# Patient Record
Sex: Female | Born: 1980 | Race: White | Hispanic: No | Marital: Single | Smoking: Never smoker
Health system: Southern US, Community
[De-identification: ages and names within clinical notes are randomized; demographics above are authoritative.]

## PROBLEM LIST (undated history)

## (undated) DIAGNOSIS — E778 Other disorders of glycoprotein metabolism: Secondary | ICD-10-CM

## (undated) DIAGNOSIS — T7840XA Allergy, unspecified, initial encounter: Secondary | ICD-10-CM

## (undated) DIAGNOSIS — Z8742 Personal history of other diseases of the female genital tract: Secondary | ICD-10-CM

## (undated) DIAGNOSIS — R771 Abnormality of globulin: Secondary | ICD-10-CM

## (undated) DIAGNOSIS — D649 Anemia, unspecified: Secondary | ICD-10-CM

## (undated) HISTORY — DX: Abnormality of globulin: R77.1

## (undated) HISTORY — DX: Personal history of other diseases of the female genital tract: Z87.42

## (undated) HISTORY — DX: Other disorders of glycoprotein metabolism: E77.8

## (undated) HISTORY — DX: Anemia, unspecified: D64.9

## (undated) HISTORY — DX: Allergy, unspecified, initial encounter: T78.40XA

---

## 2013-11-12 ENCOUNTER — Ambulatory Visit: Payer: Self-pay | Admitting: Family Medicine

## 2014-01-27 ENCOUNTER — Emergency Department: Payer: Self-pay | Admitting: Emergency Medicine

## 2014-01-27 LAB — COMPREHENSIVE METABOLIC PANEL
ANION GAP: 9 (ref 7–16)
AST: 19 U/L (ref 15–37)
Albumin: 4 g/dL (ref 3.4–5.0)
Alkaline Phosphatase: 67 U/L
BILIRUBIN TOTAL: 0.9 mg/dL (ref 0.2–1.0)
BUN: 11 mg/dL (ref 7–18)
CALCIUM: 8.8 mg/dL (ref 8.5–10.1)
Chloride: 106 mmol/L (ref 98–107)
Co2: 26 mmol/L (ref 21–32)
Creatinine: 0.96 mg/dL (ref 0.60–1.30)
EGFR (African American): >60
Glucose: 131 mg/dL — ABNORMAL HIGH (ref 65–99)
Osmolality: 282 (ref 275–301)
Potassium: 4 mmol/L (ref 3.5–5.1)
SGPT (ALT): 22 U/L
Sodium: 141 mmol/L (ref 136–145)
TOTAL PROTEIN: 8.2 g/dL (ref 6.4–8.2)

## 2014-01-27 LAB — URINALYSIS, COMPLETE
BILIRUBIN, UR: NEGATIVE
Glucose,UR: NEGATIVE mg/dL (ref 0–75)
NITRITE: NEGATIVE
PH: 5 (ref 4.5–8.0)
PROTEIN: NEGATIVE
Specific Gravity: 1.026 (ref 1.003–1.030)
WBC UR: 3 /HPF (ref 0–5)

## 2014-01-27 LAB — CBC WITH DIFFERENTIAL/PLATELET
Basophil #: 0 10*3/uL (ref 0.0–0.1)
Basophil %: 0.2 %
EOS ABS: 0.1 10*3/uL (ref 0.0–0.7)
Eosinophil %: 0.5 %
HCT: 48 % — ABNORMAL HIGH (ref 35.0–47.0)
HGB: 15.9 g/dL (ref 12.0–16.0)
Lymphocyte #: 0.6 10*3/uL — ABNORMAL LOW (ref 1.0–3.6)
Lymphocyte %: 4.1 %
MCH: 30.9 pg (ref 26.0–34.0)
MCHC: 33 g/dL (ref 32.0–36.0)
MCV: 93 fL (ref 80–100)
Monocyte #: 0.4 x10 3/mm (ref 0.2–0.9)
Monocyte %: 2.6 %
NEUTROS ABS: 14.3 10*3/uL — AB (ref 1.4–6.5)
Neutrophil %: 92.6 %
PLATELETS: 230 10*3/uL (ref 150–440)
RBC: 5.14 10*6/uL (ref 3.80–5.20)
RDW: 12.1 % (ref 11.5–14.5)
WBC: 15.4 10*3/uL — AB (ref 3.6–11.0)

## 2014-01-27 LAB — LIPASE, BLOOD: Lipase: 194 U/L (ref 73–393)

## 2014-01-27 LAB — PREGNANCY, URINE: Pregnancy Test, Urine: NEGATIVE m[IU]/mL

## 2014-01-28 LAB — MONONUCLEOSIS SCREEN: Mono Test: NEGATIVE

## 2014-01-29 ENCOUNTER — Emergency Department: Payer: Self-pay | Admitting: Emergency Medicine

## 2014-01-29 LAB — URINE CULTURE

## 2014-02-02 LAB — CULTURE, BLOOD (SINGLE)

## 2014-02-04 LAB — CULTURE, BLOOD (SINGLE)

## 2014-11-06 LAB — HM PAP SMEAR: HM PAP: NEGATIVE

## 2014-11-18 ENCOUNTER — Encounter: Payer: Self-pay | Admitting: Family Medicine

## 2014-12-18 ENCOUNTER — Telehealth: Payer: Self-pay | Admitting: Family Medicine

## 2014-12-18 NOTE — Telephone Encounter (Signed)
Routing to provider. I'm sending to both Dr. Laural BenesJohnson and Dr. Sherie DonLada. Dr. Sherie DonLada is out of the office today, but will be back tomorrow.

## 2014-12-18 NOTE — Telephone Encounter (Signed)
Pt called stated she has poison oak and the OTC meds are not helping, would like to know if something can be sent to her pharmacy. [place] [facility] [place]. Thanks.

## 2014-12-19 ENCOUNTER — Telehealth: Payer: Self-pay | Admitting: Family Medicine

## 2014-12-19 MED ORDER — TRIAMCINOLONE ACETONIDE 0.5 % EX OINT: 1.0000 "application " | TOPICAL_OINTMENT | Freq: Two times a day (BID) | CUTANEOUS | Status: DC | PRN

## 2014-12-19 NOTE — Telephone Encounter (Signed)
Let pt know she can use OTC anti-histamine such as claritin or allegra for itching; also, use OTC hydrocortisone for itching and rash; use the new Rx I just sent in for the worst spots, but that's too strong for face, groin, underarms

## 2014-12-19 NOTE — Addendum Note (Signed)
Addended by: LADA, Janit BernMELINDA P on: 12/19/2014 01:02 PM   Modules accepted: Orders

## 2014-12-19 NOTE — Telephone Encounter (Signed)
Patient notified

## 2014-12-25 ENCOUNTER — Emergency Department
Admission: EM | Admit: 2014-12-25 | Discharge: 2014-12-25 | Disposition: A | Discharge status: Home / Self Care | Payer: BLUE CROSS/BLUE SHIELD | Attending: Emergency Medicine | Admitting: Emergency Medicine

## 2014-12-25 DIAGNOSIS — Z880 Allergy status to penicillin: Secondary | ICD-10-CM | POA: Insufficient documentation

## 2014-12-25 DIAGNOSIS — Y9289 Other specified places as the place of occurrence of the external cause: Secondary | ICD-10-CM | POA: Insufficient documentation

## 2014-12-25 DIAGNOSIS — Z7951 Long term (current) use of inhaled steroids: Secondary | ICD-10-CM | POA: Insufficient documentation

## 2014-12-25 DIAGNOSIS — X58XXXA Exposure to other specified factors, initial encounter: Secondary | ICD-10-CM | POA: Insufficient documentation

## 2014-12-25 DIAGNOSIS — T161XXA Foreign body in right ear, initial encounter: Secondary | ICD-10-CM | POA: Diagnosis not present

## 2014-12-25 DIAGNOSIS — Y998 Other external cause status: Secondary | ICD-10-CM | POA: Insufficient documentation

## 2014-12-25 DIAGNOSIS — Z793 Long term (current) use of hormonal contraceptives: Secondary | ICD-10-CM | POA: Insufficient documentation

## 2014-12-25 DIAGNOSIS — Y9389 Activity, other specified: Secondary | ICD-10-CM | POA: Diagnosis not present

## 2014-12-25 DIAGNOSIS — Z79899 Other long term (current) drug therapy: Secondary | ICD-10-CM | POA: Diagnosis not present

## 2014-12-25 MED ORDER — NEOMYCIN-COLIST-HC-THONZONIUM 3.3-3-10-0.5 MG/ML OT SUSP
4.0000 [drp] | Freq: Once | OTIC | Status: DC
  Filled 2014-12-25: qty 5

## 2014-12-25 MED ORDER — NEOMYCIN-POLYMYXIN-HC 3.5-10000-1 OT SUSP
OTIC | Status: AC
  Filled 2014-12-25: qty 10

## 2014-12-25 MED ORDER — NEOMYCIN-POLYMYXIN-HC 3.5-10000-1 OT SUSP
4.0000 [drp] | Freq: Once | OTIC | Status: DC
  Filled 2014-12-25: qty 10

## 2014-12-25 NOTE — ED Provider Notes (Signed)
[facility] Emergency Department Provider Note  ____________________________________________  Time seen:  8:53 AM  I have reviewed the triage vital signs and the nursing notes.   HISTORY  Chief Complaint Foreign Body in Ear   HPI Katrina Long is a 34 y.o. female is here with complaint of something moving inside her right ear since 2 AM this morning. She rates her pain a 3 out of 10. Occasionally she does feel it moving around.   History reviewed. No pertinent past medical history.  There are no active problems to display for this patient.   History reviewed. No pertinent past surgical history.  Current Outpatient Rx  Name  Route  Sig  Dispense  Refill  . cetirizine (ZYRTEC) 10 MG tablet   Oral   Take 10 mg by mouth daily.         . fluticasone (FLONASE) 50 MCG/ACT nasal spray   Each Nare   Place 2 sprays into both nostrils daily.         Marland Kitchen. ketotifen (ZADITOR) 0.025 % ophthalmic solution      1 drop 2 (two) times daily.         . montelukast (SINGULAIR) 10 MG tablet   Oral   Take 10 mg by mouth at bedtime.         . norethindrone-ethinyl estradiol (OVCON-35,BALZIVA,BRIELLYN) 0.4-35 MG-MCG tablet   Oral   Take 1 tablet by mouth daily.         Marland Kitchen. triamcinolone ointment (KENALOG) 0.5 %   Topical   Apply 1 application topically 2 (two) times daily as needed. Too strong for face, underarms, groin   15 g   0     Allergies Entex lq and Penicillins  No family history on file.  Social History History  Substance Use Topics  . Smoking status: Never Smoker   . Smokeless tobacco: Never Used  . Alcohol Use: No    Review of Systems Constitutional: No fever/chills Respiratory: Denies shortness of breath. Gastrointestinal:   No nausea, no vomiting.  Genitourinary: Negative for dysuria. Musculoskeletal: Negative for back pain. Skin: Negative for rash. Neurological: Negative for headaches.  10-point ROS otherwise  negative.  ____________________________________________   PHYSICAL EXAM:  VITAL SIGNS: ED Triage Vitals  Enc Vitals Group     BP 12/25/14 0841 111/71 mmHg     Pulse Rate 12/25/14 0841 90     Resp 12/25/14 0841 16     Temp 12/25/14 0841 99.1 F (37.3 C)     Temp Source 12/25/14 0841 Oral     SpO2 12/25/14 0841 98 %     Weight 12/25/14 0841 205 lb (92.987 kg)     Height 12/25/14 0841 5\' 6"  (1.676 m)     Head Cir --      Peak Flow --      Pain Score 12/25/14 0843 3     Pain Loc --      Pain Edu? --      Excl. in GC? --     Constitutional: Alert and oriented. Well appearing and in no acute distress. Eyes: Conjunctivae are normal. PERRL. EOMI. Head: Atraumatic. Nose: No congestion/rhinnorhea.   Right ear exam shows foreign body that is unidentified at present. Neck: No stridor.   Cardiovascular: Normal rate, regular rhythm. Grossly normal heart sounds.  Good peripheral circulation. Respiratory: Normal respiratory effort.  No retractions. Lungs CTAB. Gastrointestinal: Soft and nontender. No distention Musculoskeletal: No lower extremity tenderness nor edema.  No joint  effusions. Neurologic:  Normal speech and language. No gross focal neurologic deficits are appreciated. No gait instability. Skin:  Skin is warm, dry and intact. No rash noted. Psychiatric: Mood and affect are normal. Speech and behavior are normal.  ____________________________________________   LABS (all labs ordered are listed, but only abnormal results are displayed)  Labs Reviewed - No data to display  PROCEDURES  Procedure(s) performed: EAC was saturated with Cortisporin otic solution. After 15 minutes ear was lavaged with saline. Multiple tiny baby in 6 of some description began coming out with a total of 17 removed. There were 2 remaining in the canal. EAC was irritated from the lavage. Patient tolerated the procedure extremely well and denied any dizziness. She will take Cortisporin otic home with her  and continue to use.  Critical Care performed: No  ____________________________________________   INITIAL IMPRESSION / ASSESSMENT AND PLAN / ED COURSE  Pertinent labs & imaging results that were available during my care of the patient were reviewed by me and considered in my medical decision making (see chart for details).  Patient is follow-up with [facility] if any continued problems. ____________________________________________   FINAL CLINICAL IMPRESSION(S) / ED DIAGNOSES  Final diagnoses:  Foreign body in ear, right, initial encounter      Tommi Rumps, PA-C 12/25/14 1030  Loleta Rose, MD 12/25/14 [phone]

## 2014-12-25 NOTE — ED Notes (Signed)
Pt c/o something moving inside right ear since 2am

## 2014-12-25 NOTE — Discharge Instructions (Signed)
Ear Foreign Body An ear foreign body is an object that is stuck in the ear. Objects in the ear can cause pain, hearing loss, and buzzing or roaring sounds. They can also cause fluid to come from the ear. HOME CARE   Keep all doctor visits as told.  Keep small objects away from children. Tell them not to put things in their ears. GET HELP RIGHT AWAY IF:   You have blood coming from your ear.  You have more pain or puffiness (swelling) in the ear.  You have trouble hearing.  You have fluid (discharge) coming from the ear.  You have a fever.  You get a headache. MAKE SURE YOU:   Understand these instructions.  Will watch your condition.  Will get help right away if you are not doing well or get worse. Document Released: 11/17/2009 Document Revised: 08/22/2011 Document Reviewed: 11/17/2009 [facility] Patient Information 2015 [employer]. This information is not intended to replace advice given to you by your health care provider. Make sure you discuss any questions you have with your health care provider.   FOLLOW UP WITH [facility] IF ANY CONTINUED PROBLEMS  USE EAR DROPS 4 TIMES A DAY FOR 4-5 DAYS TYLENOL IF NEEDED FOR PAIN

## 2015-01-27 ENCOUNTER — Ambulatory Visit (INDEPENDENT_AMBULATORY_CARE_PROVIDER_SITE_OTHER): Payer: BLUE CROSS/BLUE SHIELD | Admitting: Family Medicine

## 2015-01-27 ENCOUNTER — Encounter: Payer: Self-pay | Admitting: Family Medicine

## 2015-01-27 VITALS — BP 100/65 | HR 81 | Temp 99.9°F | Ht 65.0 in | Wt 210.0 lb

## 2015-01-27 DIAGNOSIS — T162XXA Foreign body in left ear, initial encounter: Secondary | ICD-10-CM | POA: Diagnosis not present

## 2015-01-27 DIAGNOSIS — R300 Dysuria: Secondary | ICD-10-CM

## 2015-01-27 DIAGNOSIS — Z308 Encounter for other contraceptive management: Secondary | ICD-10-CM

## 2015-01-27 DIAGNOSIS — T169XXA Foreign body in ear, unspecified ear, initial encounter: Secondary | ICD-10-CM | POA: Insufficient documentation

## 2015-01-27 LAB — URINALYSIS, ROUTINE W REFLEX MICROSCOPIC
BILIRUBIN UA: NEGATIVE
Glucose, UA: NEGATIVE
Ketones, UA: NEGATIVE
NITRITE UA: NEGATIVE
Protein, UA: NEGATIVE
Specific Gravity, UA: 1.02 (ref 1.005–1.030)
UUROB: 0.2 mg/dL (ref 0.2–1.0)
pH, UA: 5.5 (ref 5.0–7.5)

## 2015-01-27 LAB — MICROSCOPIC EXAMINATION

## 2015-01-27 MED ORDER — MEDROXYPROGESTERONE ACETATE 150 MG/ML IM SUSP
150.0000 mg | Freq: Once | INTRAMUSCULAR | Status: AC
  Administered 2015-01-27: 150 mg via INTRAMUSCULAR

## 2015-01-27 NOTE — Progress Notes (Signed)
   BP 100/65 mmHg  Pulse 81  Temp(Src) 99.9 F (37.7 C)  Ht  (1.651 m)  Wt 210 lb (95.255 kg)  BMI 34.95 kg/m2   Subjective:    Patient ID: Katrina Long, female    DOB: 01/16/1981, 34 y.o.   MRN: [id number]  HPI: Katrina Long is a 34 y.o. female  Chief Complaint  Patient presents with  . Ear Pain    right   patient treated in the emergency room for bugs in her right ear with flushing. Given eardrops Cortisporin which did not help and still continued marked pain.  Patient also with right flank pain which is similar to UTI symptoms in the past Patient also wanting Depo Provera shot   Relevant past medical, surgical, family and social history reviewed and updated as indicated. Interim medical history since our last visit reviewed. Allergies and medications reviewed and updated.  Review of Systems  Constitutional: Negative.   Respiratory: Negative.   Cardiovascular: Negative.     Per HPI unless specifically indicated above     Objective:    BP 100/65 mmHg  Pulse 81  Temp(Src) 99.9 F (37.7 C)  Ht  (1.651 m)  Wt 210 lb (95.255 kg)  BMI 34.95 kg/m2  Wt Readings from Last 3 Encounters:  01/27/15 210 lb (95.255 kg)  11/06/14 208 lb (94.348 kg)  12/25/14 205 lb (92.987 kg)    Physical Exam  Constitutional: She is oriented to person, place, and time. She appears well-developed and well-nourished. No distress.  HENT:  Head: Normocephalic and atraumatic.  Right Ear: Hearing normal.  Left Ear: Hearing normal.  Nose: Nose normal.  By flushing with water bugs and bug parts Removed from right ear revealing normal canal and TM  Eyes: Conjunctivae and lids are normal. Right eye exhibits no discharge. Left eye exhibits no discharge. No scleral icterus.  Cardiovascular: Normal rate, regular rhythm and normal heart sounds.   Pulmonary/Chest: Effort normal and breath sounds normal. No respiratory distress.  Abdominal: Soft. She exhibits no distension. There is no  tenderness. There is no rebound.  Genitourinary:  Mild right CVA tenderness no suprapubic tenderness  Musculoskeletal: Normal range of motion.  Neurological: She is alert and oriented to person, place, and time.  Skin: Skin is intact. No rash noted.  Psychiatric: She has a normal mood and affect. Her speech is normal and behavior is normal. Judgment and thought content normal. Cognition and memory are normal.        Assessment & Plan:   Problem List Items Addressed This Visit      Nervous and Auditory   FB ear    Patient with several bugs and bug parts washed from her ear with relief of symptoms. Bug parts were adjacent to tympanic membrane.       Other Visit Diagnoses    Encounter for other contraceptive management    -  Primary    Relevant Medications    medroxyPROGESTERone (DEPO-PROVERA) injection 150 mg (Completed)    Dysuria        U/A neg will obs sx    Relevant Orders    Urinalysis, Routine w reflex microscopic (not at [facility])        Follow up plan: Return if symptoms worsen or fail to improve, for And as scheduled.

## 2015-01-27 NOTE — Assessment & Plan Note (Signed)
Patient with several bugs and bug parts washed from her ear with relief of symptoms. Bug parts were adjacent to tympanic membrane.

## 2015-03-09 NOTE — Telephone Encounter (Signed)
done

## 2015-03-12 ENCOUNTER — Other Ambulatory Visit: Payer: Self-pay

## 2015-03-12 NOTE — Telephone Encounter (Signed)
Routing to provider  

## 2015-03-12 NOTE — Telephone Encounter (Signed)
Patient would like to know if you are able to prescribe her allergy medications instead of her going to her spending a $40 copay to see her allergy doctor.  Patient states the medications that she will need are Zaditor (eye drops), Montelukast  tab, and Nasacort nose spray. Note:  An RX is written for over the counter medications so that patient may pay for with her flexible spending card.  Please call patient at [phone] with any questions.

## 2015-03-13 MED ORDER — MONTELUKAST SODIUM 10 MG PO TABS: 10.0000 mg | ORAL_TABLET | Freq: Every day | ORAL | Status: DC

## 2015-03-13 MED ORDER — TRIAMCINOLONE ACETONIDE 55 MCG/ACT NA AERO: 2.0000 | INHALATION_SPRAY | Freq: Every day | NASAL | Status: DC

## 2015-03-13 MED ORDER — KETOTIFEN FUMARATE 0.025 % OP SOLN: 1.0000 [drp] | Freq: Two times a day (BID) | OPHTHALMIC | Status: DC

## 2015-03-13 NOTE — Telephone Encounter (Signed)
Rx's sent as requested. 

## 2015-05-13 ENCOUNTER — Other Ambulatory Visit: Payer: Self-pay | Admitting: Family Medicine

## 2015-05-13 ENCOUNTER — Ambulatory Visit (INDEPENDENT_AMBULATORY_CARE_PROVIDER_SITE_OTHER): Payer: BLUE CROSS/BLUE SHIELD

## 2015-05-13 DIAGNOSIS — Z3042 Encounter for surveillance of injectable contraceptive: Secondary | ICD-10-CM

## 2015-05-13 DIAGNOSIS — Z3040 Encounter for surveillance of contraceptives, unspecified: Secondary | ICD-10-CM | POA: Diagnosis not present

## 2015-05-13 LAB — PREGNANCY, URINE: Preg Test, Ur: NEGATIVE

## 2015-05-13 MED ORDER — MEDROXYPROGESTERONE ACETATE 150 MG/ML IM SUSP
150.0000 mg | Freq: Once | INTRAMUSCULAR | Status: AC
  Administered 2015-05-13: 150 mg via INTRAMUSCULAR

## 2015-07-10 ENCOUNTER — Other Ambulatory Visit: Payer: Self-pay | Admitting: Allergy and Immunology

## 2015-07-10 ENCOUNTER — Other Ambulatory Visit: Payer: Self-pay | Admitting: Family Medicine

## 2015-07-11 NOTE — Telephone Encounter (Signed)
approved

## 2015-07-13 ENCOUNTER — Other Ambulatory Visit: Payer: Self-pay | Admitting: Family Medicine

## 2015-07-13 MED ORDER — MONTELUKAST SODIUM 10 MG PO TABS: 10.0000 mg | ORAL_TABLET | Freq: Every day | ORAL | Status: DC

## 2015-07-13 MED ORDER — CETIRIZINE HCL 10 MG PO TABS: 10.0000 mg | ORAL_TABLET | Freq: Every day | ORAL | Status: DC

## 2015-07-13 NOTE — Telephone Encounter (Signed)
Pt called stated her RX's were at [facility] but they closed and her medications did not get transferred and she had 11 refills on all medications. She needs the following medication sent to her new pharmacy:  Nasacort Montelukast  [facility] [place]. [address] Lynford Humphrey.

## 2015-07-13 NOTE — Telephone Encounter (Signed)
Rx's approved.

## 2015-07-13 NOTE — Telephone Encounter (Signed)
Routing to provider, she needs new rx's.

## 2015-07-14 NOTE — Telephone Encounter (Signed)
In order for the pt to use her flex spending account card she needs to have a rx sent in for nasacort. Send to [facility] [address]

## 2015-07-15 MED ORDER — TRIAMCINOLONE ACETONIDE 55 MCG/ACT NA AERO: 2.0000 | INHALATION_SPRAY | Freq: Every day | NASAL | Status: DC

## 2015-07-15 NOTE — Telephone Encounter (Signed)
Routing to provider, she needs rx for nasacort.

## 2015-07-15 NOTE — Telephone Encounter (Signed)
done

## 2015-07-15 NOTE — Addendum Note (Signed)
Addended byClaudine Mouton J on: 07/15/2015 09:08 AM   Modules accepted: Orders

## 2015-07-15 NOTE — Addendum Note (Signed)
Addended by: LADA, Janit Bern on: 07/15/2015 12:28 PM   Modules accepted: Orders

## 2015-07-31 ENCOUNTER — Other Ambulatory Visit: Payer: Self-pay | Admitting: Family Medicine

## 2015-07-31 ENCOUNTER — Ambulatory Visit (INDEPENDENT_AMBULATORY_CARE_PROVIDER_SITE_OTHER): Payer: Managed Care, Other (non HMO)

## 2015-07-31 DIAGNOSIS — Z3040 Encounter for surveillance of contraceptives, unspecified: Secondary | ICD-10-CM

## 2015-07-31 MED ORDER — MEDROXYPROGESTERONE ACETATE 150 MG/ML IM SUSP
150.0000 mg | Freq: Once | INTRAMUSCULAR | Status: AC
  Administered 2015-07-31: 150 mg via INTRAMUSCULAR

## 2015-07-31 NOTE — Assessment & Plan Note (Signed)
Times one today

## 2015-10-28 ENCOUNTER — Other Ambulatory Visit: Payer: Self-pay | Admitting: Family Medicine

## 2015-10-28 ENCOUNTER — Ambulatory Visit (INDEPENDENT_AMBULATORY_CARE_PROVIDER_SITE_OTHER): Payer: Managed Care, Other (non HMO)

## 2015-10-28 DIAGNOSIS — Z3042 Encounter for surveillance of injectable contraceptive: Secondary | ICD-10-CM

## 2015-10-28 DIAGNOSIS — Z3040 Encounter for surveillance of contraceptives, unspecified: Secondary | ICD-10-CM

## 2015-10-28 MED ORDER — MEDROXYPROGESTERONE ACETATE 150 MG/ML IM SUSP
150.0000 mg | INTRAMUSCULAR | Status: AC
  Administered 2015-10-28 – 2016-01-20 (×2): 150 mg via INTRAMUSCULAR

## 2015-11-10 ENCOUNTER — Encounter: Payer: Self-pay | Admitting: Family Medicine

## 2015-11-10 ENCOUNTER — Ambulatory Visit (INDEPENDENT_AMBULATORY_CARE_PROVIDER_SITE_OTHER): Payer: Managed Care, Other (non HMO) | Admitting: Family Medicine

## 2015-11-10 VITALS — BP 96/68 | HR 80 | Temp 99.5°F | Ht 64.7 in | Wt 217.0 lb

## 2015-11-10 DIAGNOSIS — N3000 Acute cystitis without hematuria: Secondary | ICD-10-CM | POA: Diagnosis not present

## 2015-11-10 DIAGNOSIS — Z113 Encounter for screening for infections with a predominantly sexual mode of transmission: Secondary | ICD-10-CM | POA: Diagnosis not present

## 2015-11-10 DIAGNOSIS — B977 Papillomavirus as the cause of diseases classified elsewhere: Secondary | ICD-10-CM

## 2015-11-10 DIAGNOSIS — Z124 Encounter for screening for malignant neoplasm of cervix: Secondary | ICD-10-CM | POA: Diagnosis not present

## 2015-11-10 DIAGNOSIS — Z91048 Other nonmedicinal substance allergy status: Secondary | ICD-10-CM | POA: Diagnosis not present

## 2015-11-10 DIAGNOSIS — R888 Abnormal findings in other body fluids and substances: Secondary | ICD-10-CM | POA: Diagnosis not present

## 2015-11-10 DIAGNOSIS — Z0000 Encounter for general adult medical examination without abnormal findings: Secondary | ICD-10-CM | POA: Diagnosis not present

## 2015-11-10 DIAGNOSIS — Z9109 Other allergy status, other than to drugs and biological substances: Secondary | ICD-10-CM | POA: Insufficient documentation

## 2015-11-10 DIAGNOSIS — IMO0002 Reserved for concepts with insufficient information to code with codable children: Secondary | ICD-10-CM | POA: Insufficient documentation

## 2015-11-10 MED ORDER — CIPROFLOXACIN HCL 500 MG PO TABS: 500.0000 mg | ORAL_TABLET | Freq: Two times a day (BID) | ORAL | Status: DC

## 2015-11-10 MED ORDER — CETIRIZINE HCL 10 MG PO TABS: 10.0000 mg | ORAL_TABLET | Freq: Every day | ORAL | Status: DC

## 2015-11-10 MED ORDER — MONTELUKAST SODIUM 10 MG PO TABS: 10.0000 mg | ORAL_TABLET | Freq: Every day | ORAL | Status: DC

## 2015-11-10 MED ORDER — TRIAMCINOLONE ACETONIDE 55 MCG/ACT NA AERO: 2.0000 | INHALATION_SPRAY | Freq: Every day | NASAL | Status: DC

## 2015-11-10 MED ORDER — KETOTIFEN FUMARATE 0.025 % OP SOLN: OPHTHALMIC | Status: DC

## 2015-11-10 NOTE — Assessment & Plan Note (Signed)
Rechecking pap with co-testing today. Await results.

## 2015-11-10 NOTE — Addendum Note (Signed)
Addended by: Dorcas CarrowJOHNSON, MEGAN P on: 11/10/2015 10:44 AM   Modules accepted: Orders, SmartSet

## 2015-11-10 NOTE — Assessment & Plan Note (Signed)
Under good control on current regimen. Refills given today. Call with any concerns.  

## 2015-11-10 NOTE — Progress Notes (Signed)
BP 96/68 mmHg  Pulse 80  Temp(Src) 99.5 F (37.5 C)  Ht 5' 4.7" (1.643 m)  Wt 217 lb (98.431 kg)  BMI 36.46 kg/m2  SpO2 99%   Subjective:    Patient ID: Katrina Long, female    DOB: 03/04/1981, 35 y.o.   MRN: [id number]  HPI: Katrina Long is a 35 y.o. female presenting on 11/10/2015 for comprehensive medical examination. Current medical complaints include:  STD SCREENING Sexual activity:  Practices careful partner selection, always uses condoms Contraception: yes Recent unprotected intercourse: no History of sexually transmitted diseases: no Previous sexually transmitted disease screening: yes Genital lesions: no Penile discharge: no Dysuria: no Swollen lymph nodes: no Fevers: no Rash: no  ALLERGIES Duration: chronic Runny nose: yes "clear Nasal congestion: yes Nasal itching: yes Sneezing: yes Eye swelling, itching or discharge: yes Post nasal drip: yes Cough: yes, non-productive Sinus pressure: no  Ear pain: no  Ear pressure: no  Fever: yes low grade Symptoms occur seasonally: no Symptoms occur perenially: yes Satisfied with current treatment: yes   She currently lives with: with her kids Menopausal Symptoms: no  Depression Screen done today and results listed below:  Depression screen [facility] 11/10/2015  Decreased Interest 0  Down, Depressed, Hopeless 0  PHQ - 2 Score 0    Past Medical History:  Past Medical History  Diagnosis Date  . Allergy   . Anemia   . History of abnormal cervical Pap smear   . Hypoproteinemia (HCC)   . Hypoglobulinemia     Surgical History:  History reviewed. No pertinent past surgical history.  Medications:  No current outpatient prescriptions on file prior to visit.   Current Facility-Administered Medications on File Prior to Visit  Medication  . medroxyPROGESTERone (DEPO-PROVERA) injection 150 mg    Allergies:  Allergies  Allergen Reactions  . Entex Lq [Phenylephrine-Guaifenesin] Rash  . Penicillins Rash    Social  History:  Social History   Social History  . Marital Status: Single    Spouse Name: N/A  . Number of Children: N/A  . Years of Education: N/A   Occupational History  . Not on file.   Social History Main Topics  . Smoking status: Never Smoker   . Smokeless tobacco: Never Used  . Alcohol Use: No  . Drug Use: No  . Sexual Activity: Yes   Other Topics Concern  . Not on file   Social History Narrative   History  Smoking status  . Never Smoker   Smokeless tobacco  . Never Used   History  Alcohol Use No    Family History:  Family History  Problem Relation Age of Onset  . Mental illness Mother   . Lung disease Mother   . Diabetes Sister   . Mental illness Brother   . Hypertension Maternal Grandmother   . Cancer Maternal Aunt 50    Breast    Past medical history, surgical history, medications, allergies, family history and social history reviewed with patient today and changes made to appropriate areas of the chart.   Review of Systems  Constitutional: Positive for malaise/fatigue. Negative for fever, chills, weight loss and diaphoresis.  HENT: Negative.   Eyes: Negative.   Respiratory: Negative.   Cardiovascular: Positive for chest pain (very occasionally, 1x a month, not sure where it's coming from, thinks it might be heart burn). Negative for palpitations, orthopnea, claudication, leg swelling and PND.  Gastrointestinal: Positive for heartburn. Negative for nausea, vomiting, abdominal pain, diarrhea, constipation, blood  in stool and melena.  Genitourinary: Negative.   Musculoskeletal: Negative.   Skin: Negative.   Neurological: Negative.  Negative for weakness.  Endo/Heme/Allergies: Positive for environmental allergies. Negative for polydipsia. Bruises/bleeds easily.  Psychiatric/Behavioral: Negative.     All other ROS negative except what is listed above and in the HPI.      Objective:    BP 96/68 mmHg  Pulse 80  Temp(Src) 99.5 F (37.5 C)  Ht 5'  4.7" (1.643 m)  Wt 217 lb (98.431 kg)  BMI 36.46 kg/m2  SpO2 99%  Wt Readings from Last 3 Encounters:  11/10/15 217 lb (98.431 kg)  01/27/15 210 lb (95.255 kg)  11/06/14 208 lb (94.348 kg)    Physical Exam  Constitutional: She is oriented to person, place, and time. She appears well-developed and well-nourished. No distress.  HENT:  Head: Normocephalic and atraumatic.  Right Ear: Hearing, tympanic membrane, external ear and ear canal normal.  Left Ear: Hearing, tympanic membrane, external ear and ear canal normal.  Nose: Nose normal.  Mouth/Throat: Uvula is midline, oropharynx is clear and moist and mucous membranes are normal. No oropharyngeal exudate.  Eyes: Conjunctivae, EOM and lids are normal. Pupils are equal, round, and reactive to light. Right eye exhibits no discharge. Left eye exhibits no discharge. No scleral icterus.  Neck: Normal range of motion. Neck supple. No JVD present. No tracheal deviation present. No thyromegaly present.  Cardiovascular: Normal rate, regular rhythm, normal heart sounds and intact distal pulses.  Exam reveals no gallop and no friction rub.   No murmur heard. Pulmonary/Chest: Effort normal and breath sounds normal. No stridor. No respiratory distress. She has no wheezes. She has no rales. She exhibits no tenderness. Right breast exhibits no inverted nipple, no mass, no nipple discharge, no skin change and no tenderness. Left breast exhibits no inverted nipple, no mass, no nipple discharge, no skin change and no tenderness. Breasts are symmetrical.  Abdominal: Soft. Bowel sounds are normal. She exhibits no distension and no mass. There is no tenderness. There is no rebound and no guarding. Hernia confirmed negative in the right inguinal area and confirmed negative in the left inguinal area.  Genitourinary: Vagina normal and uterus normal. No labial fusion. There is no rash, tenderness, lesion or injury on the right labia. There is no rash, tenderness, lesion  or injury on the left labia. Uterus is not deviated, not enlarged, not fixed and not tender. Cervix exhibits no motion tenderness, no discharge and no friability. Right adnexum displays no mass, no tenderness and no fullness. Left adnexum displays no mass, no tenderness and no fullness. No erythema, tenderness or bleeding in the vagina. No foreign body around the vagina. No signs of injury around the vagina. No vaginal discharge found.  Musculoskeletal: Normal range of motion. She exhibits no edema or tenderness.  Lymphadenopathy:    She has no cervical adenopathy.       Right: No inguinal adenopathy present.       Left: No inguinal adenopathy present.  Neurological: She is alert and oriented to person, place, and time. She has normal reflexes. She displays normal reflexes. No cranial nerve deficit. She exhibits normal muscle tone. Coordination normal.  Skin: Skin is warm and intact. No rash noted. She is not diaphoretic. No erythema. No pallor.  Psychiatric: She has a normal mood and affect. Her speech is normal and behavior is normal. Judgment and thought content normal. Cognition and memory are normal.  Nursing note and vitals reviewed.  Results for orders placed or performed in visit on 11/10/15  HM PAP SMEAR  Result Value Ref Range   HM Pap smear Negative Pap, + HPV       Assessment & Plan:   Problem List Items Addressed This Visit      Other   HPV test positive    Rechecking pap with co-testing today. Await results.      Relevant Orders   IGP, Aptima HPV, rfx 16/18,45   Environmental allergies    Under good control on current regimen. Refills given today. Call with any concerns.        Other Visit Diagnoses    Routine general medical examination at a health care facility    -  Primary    Up to date on vaccines. Screening labs checked today. Continue diet and exercise. Call with any concerns.     Relevant Orders    CBC with Differential/Platelet    Comprehensive metabolic  panel    Lipid Panel w/o Chol/HDL Ratio    TSH    UA/M w/rflx Culture, Routine    Screening for STD (sexually transmitted disease)        Labs drawn today. Await results.     Relevant Orders    GC/Chlamydia Probe Amp    HSV(herpes simplex vrs) 1+2 ab-IgG    HIV antibody    RPR    Hepatitis, Acute    Screening for cervical cancer        Pap done today. Await results.    Relevant Orders    IGP, Aptima HPV, rfx 16/18,45    Acute cystitis without hematuria        On UA. Will treat with cipro x 3 days. Call with any concerns or if not getting better.         Follow up plan: Return in about 6 months (around 05/12/2016) for follow up allergies.   LABORATORY TESTING:  - Pap smear: pap done  IMMUNIZATIONS:   - Tdap: Tetanus vaccination status reviewed: last tetanus booster within 10 years. - Influenza: Postponed to flu season - Pneumovax: Up to date  PATIENT COUNSELING:   Advised to take 1 mg of folate supplement per day if capable of pregnancy.   Sexuality: Discussed sexually transmitted diseases, partner selection, use of condoms, avoidance of unintended pregnancy  and contraceptive alternatives.   Advised to avoid cigarette smoking.  I discussed with the patient that most people either abstain from alcohol or drink within safe limits (<=14/week and <=4 drinks/occasion for males, <=7/weeks and <= 3 drinks/occasion for females) and that the risk for alcohol disorders and other health effects rises proportionally with the number of drinks per week and how often a drinker exceeds daily limits.  Discussed cessation/primary prevention of drug use and availability of treatment for abuse.   Diet: Encouraged to adjust caloric intake to maintain  or achieve ideal body weight, to reduce intake of dietary saturated fat and total fat, to limit sodium intake by avoiding high sodium foods and not adding table salt, and to maintain adequate dietary potassium and calcium preferably from fresh  fruits, vegetables, and low-fat dairy products.    stressed the importance of regular exercise  Injury prevention: Discussed safety belts, safety helmets, smoke detector, smoking near bedding or upholstery.   Dental health: Discussed importance of regular tooth brushing, flossing, and dental visits.    NEXT PREVENTATIVE PHYSICAL DUE IN 1 YEAR. Return in about 6 months (around 05/12/2016) for follow up allergies.

## 2015-11-10 NOTE — Patient Instructions (Signed)
Health Maintenance, Female Adopting a healthy lifestyle and getting preventive care can go a long way to promote health and wellness. Talk with your health care provider about what schedule of regular examinations is right for you. This is a good chance for you to check in with your provider about disease prevention and staying healthy. In between checkups, there are plenty of things you can do on your own. Experts have done a lot of research about which lifestyle changes and preventive measures are most likely to keep you healthy. Ask your health care provider for more information. WEIGHT AND DIET  Eat a healthy diet  Be sure to include plenty of vegetables, fruits, low-fat dairy products, and lean protein.  Do not eat a lot of foods high in solid fats, added sugars, or salt.  Get regular exercise. This is one of the most important things you can do for your health.  Most adults should exercise for at least 150 minutes each week. The exercise should increase your heart rate and make you sweat (moderate-intensity exercise).  Most adults should also do strengthening exercises at least twice a week. This is in addition to the moderate-intensity exercise.  Maintain a healthy weight  Body mass index (BMI) is a measurement that can be used to identify possible weight problems. It estimates body fat based on height and weight. Your health care provider can help determine your BMI and help you achieve or maintain a healthy weight.  For females 20 years of age and older:   A BMI below 18.5 is considered underweight.  A BMI of 18.5 to 24.9 is normal.  A BMI of 25 to 29.9 is considered overweight.  A BMI of 30 and above is considered obese.  Watch levels of cholesterol and blood lipids  You should start having your blood tested for lipids and cholesterol at 35 years of age, then have this test every 5 years.  You may need to have your cholesterol levels checked more often if:  Your lipid  or cholesterol levels are high.  You are older than 35 years of age.  You are at high risk for heart disease.  CANCER SCREENING   Lung Cancer  Lung cancer screening is recommended for adults 55-80 years old who are at high risk for lung cancer because of a history of smoking.  A yearly low-dose CT scan of the lungs is recommended for people who:  Currently smoke.  Have quit within the past 15 years.  Have at least a 30-pack-year history of smoking. A pack year is smoking an average of one pack of cigarettes a day for 1 year.  Yearly screening should continue until it has been 15 years since you quit.  Yearly screening should stop if you develop a health problem that would prevent you from having lung cancer treatment.  Breast Cancer  Practice breast self-awareness. This means understanding how your breasts normally appear and feel.  It also means doing regular breast self-exams. Let your health care provider know about any changes, no matter how small.  If you are in your 20s or 30s, you should have a clinical breast exam (CBE) by a health care provider every 1-3 years as part of a regular health exam.  If you are 40 or older, have a CBE every year. Also consider having a breast X-ray (mammogram) every year.  If you have a family history of breast cancer, talk to your health care provider about genetic screening.  If you   are at high risk for breast cancer, talk to your health care provider about having an MRI and a mammogram every year.  Breast cancer gene (BRCA) assessment is recommended for women who have family members with BRCA-related cancers. BRCA-related cancers include:  Breast.  Ovarian.  Tubal.  Peritoneal cancers.  Results of the assessment will determine the need for genetic counseling and BRCA1 and BRCA2 testing. Cervical Cancer Your health care provider may recommend that you be screened regularly for cancer of the pelvic organs (ovaries, uterus, and  vagina). This screening involves a pelvic examination, including checking for microscopic changes to the surface of your cervix (Pap test). You may be encouraged to have this screening done every 3 years, beginning at age 21.  For women ages 30-65, health care providers may recommend pelvic exams and Pap testing every 3 years, or they may recommend the Pap and pelvic exam, combined with testing for human papilloma virus (HPV), every 5 years. Some types of HPV increase your risk of cervical cancer. Testing for HPV may also be done on women of any age with unclear Pap test results.  Other health care providers may not recommend any screening for nonpregnant women who are considered low risk for pelvic cancer and who do not have symptoms. Ask your health care provider if a screening pelvic exam is right for you.  If you have had past treatment for cervical cancer or a condition that could lead to cancer, you need Pap tests and screening for cancer for at least 20 years after your treatment. If Pap tests have been discontinued, your risk factors (such as having a new sexual partner) need to be reassessed to determine if screening should resume. Some women have medical problems that increase the chance of getting cervical cancer. In these cases, your health care provider may recommend more frequent screening and Pap tests. Colorectal Cancer  This type of cancer can be detected and often prevented.  Routine colorectal cancer screening usually begins at 35 years of age and continues through 35 years of age.  Your health care provider may recommend screening at an earlier age if you have risk factors for colon cancer.  Your health care provider may also recommend using home test kits to check for hidden blood in the stool.  A small camera at the end of a tube can be used to examine your colon directly (sigmoidoscopy or colonoscopy). This is done to check for the earliest forms of colorectal  cancer.  Routine screening usually begins at age 50.  Direct examination of the colon should be repeated every 5-10 years through 35 years of age. However, you may need to be screened more often if early forms of precancerous polyps or small growths are found. Skin Cancer  Check your skin from head to toe regularly.  Tell your health care provider about any new moles or changes in moles, especially if there is a change in a mole's shape or color.  Also tell your health care provider if you have a mole that is larger than the size of a pencil eraser.  Always use sunscreen. Apply sunscreen liberally and repeatedly throughout the day.  Protect yourself by wearing long sleeves, pants, a wide-brimmed hat, and sunglasses whenever you are outside. HEART DISEASE, DIABETES, AND HIGH BLOOD PRESSURE   High blood pressure causes heart disease and increases the risk of stroke. High blood pressure is more likely to develop in:  People who have blood pressure in the high end   of the normal range (130-139/85-89 mm Hg).  People who are overweight or obese.  People who are African American.  If you are 38-23 years of age, have your blood pressure checked every 3-5 years. If you are 61 years of age or older, have your blood pressure checked every year. You should have your blood pressure measured twice--once when you are at a hospital or clinic, and once when you are not at a hospital or clinic. Record the average of the two measurements. To check your blood pressure when you are not at a hospital or clinic, you can use:  An automated blood pressure machine at a pharmacy.  A home blood pressure monitor.  If you are between 45 years and 39 years old, ask your health care provider if you should take aspirin to prevent strokes.  Have regular diabetes screenings. This involves taking a blood sample to check your fasting blood sugar level.  If you are at a normal weight and have a low risk for diabetes,  have this test once every three years after 35 years of age.  If you are overweight and have a high risk for diabetes, consider being tested at a younger age or more often. PREVENTING INFECTION  Hepatitis B  If you have a higher risk for hepatitis B, you should be screened for this virus. You are considered at high risk for hepatitis B if:  You were born in a country where hepatitis B is common. Ask your health care provider which countries are considered high risk.  Your parents were born in a high-risk country, and you have not been immunized against hepatitis B (hepatitis B vaccine).  You have HIV or AIDS.  You use needles to inject street drugs.  You live with someone who has hepatitis B.  You have had sex with someone who has hepatitis B.  You get hemodialysis treatment.  You take certain medicines for conditions, including cancer, organ transplantation, and autoimmune conditions. Hepatitis C  Blood testing is recommended for:  Everyone born from [birth year] through 1965.  Anyone with known risk factors for hepatitis C. Sexually transmitted infections (STIs)  You should be screened for sexually transmitted infections (STIs) including gonorrhea and chlamydia if:  You are sexually active and are younger than 35 years of age.  You are older than 35 years of age and your health care provider tells you that you are at risk for this type of infection.  Your sexual activity has changed since you were last screened and you are at an increased risk for chlamydia or gonorrhea. Ask your health care provider if you are at risk.  If you do not have HIV, but are at risk, it may be recommended that you take a prescription medicine daily to prevent HIV infection. This is called pre-exposure prophylaxis (PrEP). You are considered at risk if:  You are sexually active and do not regularly use condoms or know the HIV status of your partner(s).  You take drugs by injection.  You are sexually  active with a partner who has HIV. Talk with your health care provider about whether you are at high risk of being infected with HIV. If you choose to begin PrEP, you should first be tested for HIV. You should then be tested every 3 months for as long as you are taking PrEP.  PREGNANCY   If you are premenopausal and you may become pregnant, ask your health care provider about preconception counseling.  If you may  become pregnant, take 400 to 800 micrograms (mcg) of folic acid every day.  If you want to prevent pregnancy, talk to your health care provider about birth control (contraception). OSTEOPOROSIS AND MENOPAUSE   Osteoporosis is a disease in which the bones lose minerals and strength with aging. This can result in serious bone fractures. Your risk for osteoporosis can be identified using a bone density scan.  If you are 61 years of age or older, or if you are at risk for osteoporosis and fractures, ask your health care provider if you should be screened.  Ask your health care provider whether you should take a calcium or vitamin D supplement to lower your risk for osteoporosis.  Menopause may have certain physical symptoms and risks.  Hormone replacement therapy may reduce some of these symptoms and risks. Talk to your health care provider about whether hormone replacement therapy is right for you.  HOME CARE INSTRUCTIONS   Schedule regular health, dental, and eye exams.  Stay current with your immunizations.   Do not use any tobacco products including cigarettes, chewing tobacco, or electronic cigarettes.  If you are pregnant, do not drink alcohol.  If you are breastfeeding, limit how much and how often you drink alcohol.  Limit alcohol intake to no more than 1 drink per day for nonpregnant women. One drink equals 12 ounces of beer, 5 ounces of wine, or 1 ounces of hard liquor.  Do not use street drugs.  Do not share needles.  Ask your health care provider for help if  you need support or information about quitting drugs.  Tell your health care provider if you often feel depressed.  Tell your health care provider if you have ever been abused or do not feel safe at home.   This information is not intended to replace advice given to you by your health care provider. Make sure you discuss any questions you have with your health care provider.   Document Released: 12/13/2010 Document Revised: 06/20/2014 Document Reviewed: 05/01/2013 Elsevier Interactive Patient Education [employer].

## 2015-11-11 ENCOUNTER — Encounter: Payer: Self-pay | Admitting: Family Medicine

## 2015-11-11 ENCOUNTER — Telehealth: Payer: Self-pay | Admitting: Family Medicine

## 2015-11-11 LAB — COMPREHENSIVE METABOLIC PANEL
ALK PHOS: 75 IU/L (ref 39–117)
ALT: 15 IU/L (ref 0–32)
AST: 15 IU/L (ref 0–40)
Albumin/Globulin Ratio: 2 (ref 1.2–2.2)
Albumin: 4.5 g/dL (ref 3.5–5.5)
BUN/Creatinine Ratio: 11 (ref 9–23)
BUN: 9 mg/dL (ref 6–20)
Bilirubin Total: 0.5 mg/dL (ref 0.0–1.2)
CO2: 20 mmol/L (ref 18–29)
CREATININE: 0.81 mg/dL (ref 0.57–1.00)
Calcium: 9.3 mg/dL (ref 8.7–10.2)
Chloride: 102 mmol/L (ref 96–106)
GFR calc Af Amer: 109 mL/min/{1.73_m2} (ref >59)
GFR calc non Af Amer: 94 mL/min/{1.73_m2} (ref >59)
GLUCOSE: 87 mg/dL (ref 65–99)
Globulin, Total: 2.2 g/dL (ref 1.5–4.5)
Potassium: 4.2 mmol/L (ref 3.5–5.2)
SODIUM: 138 mmol/L (ref 134–144)
Total Protein: 6.7 g/dL (ref 6.0–8.5)

## 2015-11-11 LAB — CBC WITH DIFFERENTIAL/PLATELET
BASOS ABS: 0 10*3/uL (ref 0.0–0.2)
BASOS: 0 %
EOS (ABSOLUTE): 0.1 10*3/uL (ref 0.0–0.4)
Eos: 1 %
Hematocrit: 42 % (ref 34.0–46.6)
Hemoglobin: 14.3 g/dL (ref 11.1–15.9)
Immature Grans (Abs): 0.1 10*3/uL (ref 0.0–0.1)
Immature Granulocytes: 1 %
LYMPHS ABS: 2.3 10*3/uL (ref 0.7–3.1)
Lymphs: 28 %
MCH: 30.6 pg (ref 26.6–33.0)
MCHC: 34 g/dL (ref 31.5–35.7)
MCV: 90 fL (ref 79–97)
MONOCYTES: 5 %
Monocytes Absolute: 0.4 10*3/uL (ref 0.1–0.9)
NEUTROS ABS: 5.4 10*3/uL (ref 1.4–7.0)
Neutrophils: 65 %
Platelets: 230 10*3/uL (ref 150–379)
RBC: 4.67 x10E6/uL (ref 3.77–5.28)
RDW: 12.9 % (ref 12.3–15.4)
WBC: 8.3 10*3/uL (ref 3.4–10.8)

## 2015-11-11 LAB — HEPATITIS PANEL, ACUTE
HEP A IGM: NEGATIVE
Hep B C IgM: NEGATIVE
Hepatitis B Surface Ag: NEGATIVE

## 2015-11-11 LAB — RPR: RPR Ser Ql: NONREACTIVE

## 2015-11-11 LAB — LIPID PANEL W/O CHOL/HDL RATIO
CHOLESTEROL TOTAL: 148 mg/dL (ref 100–199)
HDL: 36 mg/dL — ABNORMAL LOW (ref >39)
LDL CALC: 83 mg/dL (ref 0–99)
TRIGLYCERIDES: 143 mg/dL (ref 0–149)
VLDL CHOLESTEROL CAL: 29 mg/dL (ref 5–40)

## 2015-11-11 LAB — HSV(HERPES SIMPLEX VRS) I + II AB-IGG: HSV 2 Glycoprotein G Ab, IgG: <0.91 index (ref 0.00–0.90)

## 2015-11-11 LAB — HIV ANTIBODY (ROUTINE TESTING W REFLEX): HIV Screen 4th Generation wRfx: NONREACTIVE

## 2015-11-11 LAB — TSH: TSH: 2.51 u[IU]/mL (ref 0.450–4.500)

## 2015-11-11 LAB — GC/CHLAMYDIA PROBE AMP
CHLAMYDIA, DNA PROBE: NEGATIVE
NEISSERIA GONORRHOEAE BY PCR: NEGATIVE

## 2015-11-11 NOTE — Telephone Encounter (Signed)
Please let her know that everything looks nice and normal so far. She has been exposed to the cold sore virus (the one on her mouth) and we are still waiting on her pap and her GC/chlamydia. We will let her know when we get the results. I've written a letter for her too if you wouldn't mind sending it to her. Thanks!!

## 2015-11-12 LAB — UA/M W/RFLX CULTURE, ROUTINE
BILIRUBIN UA: NEGATIVE
Glucose, UA: NEGATIVE
KETONES UA: NEGATIVE
NITRITE UA: NEGATIVE
Protein, UA: NEGATIVE
Specific Gravity, UA: 1.02 (ref 1.005–1.030)
UUROB: 0.2 mg/dL (ref 0.2–1.0)
pH, UA: 5.5 (ref 5.0–7.5)

## 2015-11-12 LAB — MICROSCOPIC EXAMINATION

## 2015-11-12 LAB — URINE CULTURE, REFLEX

## 2015-11-13 LAB — IGP, APTIMA HPV, RFX 16/18,45
HPV APTIMA: NEGATIVE
PAP Smear Comment: 0

## 2016-01-20 ENCOUNTER — Ambulatory Visit (INDEPENDENT_AMBULATORY_CARE_PROVIDER_SITE_OTHER): Payer: Managed Care, Other (non HMO)

## 2016-01-20 DIAGNOSIS — Z3042 Encounter for surveillance of injectable contraceptive: Secondary | ICD-10-CM | POA: Diagnosis not present

## 2016-03-11 ENCOUNTER — Ambulatory Visit (INDEPENDENT_AMBULATORY_CARE_PROVIDER_SITE_OTHER): Payer: Managed Care, Other (non HMO) | Admitting: Family Medicine

## 2016-03-11 ENCOUNTER — Encounter: Payer: Self-pay | Admitting: Family Medicine

## 2016-03-11 ENCOUNTER — Telehealth: Payer: Self-pay | Admitting: Family Medicine

## 2016-03-11 VITALS — BP 91/67 | HR 84 | Temp 99.0°F | Wt 215.0 lb

## 2016-03-11 DIAGNOSIS — R509 Fever, unspecified: Secondary | ICD-10-CM

## 2016-03-11 MED ORDER — SULFAMETHOXAZOLE-TRIMETHOPRIM 800-160 MG PO TABS: 1.0000 | ORAL_TABLET | Freq: Two times a day (BID) | ORAL | 0 refills | Status: DC

## 2016-03-11 NOTE — Telephone Encounter (Signed)
Please call patient and let her know that her urine did show an infection which could be why she is feeling so off. Will send in an antibiotic for her, but let her know to follow up next week if no improvement in symptoms.

## 2016-03-11 NOTE — Telephone Encounter (Signed)
Patient notified

## 2016-03-11 NOTE — Progress Notes (Signed)
BP 91/67   Pulse 84   Temp 99 F (37.2 C)   Wt 215 lb (97.5 kg)   LMP  (LMP Unknown)   SpO2 99%   BMI 36.11 kg/m    Subjective:    Patient ID: Katrina Long, female    DOB: 09/14/1980, 35 y.o.   MRN: [id number]  HPI: Katrina Long is a 35 y.o. female  Chief Complaint  Patient presents with  . Fatigue    x 2 weeks. Has just not felt right, run down. Low grade fever.  Had been checking BP at home and it was saying the bottom number was low, then was normal when checked here.   Patient presents with 2 week history of malaise, fatigue, and low grade fever (tMax 99.4). Has been checking her BP at home and has been getting low readings. Starting to feel slightly lightheaded occasionally. Drinking lots of water. No cough, congestion, sore throat, N/V/D, CP, or urinary symptoms. Has had some asymptomatic UTIs in the past. No sick contacts that she is aware of.  Does note that her cuff has been pretty inaccurate when compared to other cuffs and this isn't the usual one she uses typically.   Relevant past medical, surgical, family and social history reviewed and updated as indicated. Interim medical history since our last visit reviewed. Allergies and medications reviewed and updated.  Review of Systems  Constitutional: Positive for fatigue and fever.  HENT: Negative.   Eyes: Negative.   Respiratory: Negative.   Cardiovascular: Negative.   Gastrointestinal: Negative.   Genitourinary: Negative.   Musculoskeletal: Negative.   Skin: Negative.   Neurological: Positive for light-headedness.  Psychiatric/Behavioral: Negative.     Per HPI unless specifically indicated above     Objective:    BP 91/67   Pulse 84   Temp 99 F (37.2 C)   Wt 215 lb (97.5 kg)   LMP  (LMP Unknown)   SpO2 99%   BMI 36.11 kg/m   Wt Readings from Last 3 Encounters:  03/11/16 215 lb (97.5 kg)  11/10/15 217 lb (98.4 kg)  01/27/15 210 lb (95.3 kg)    Physical Exam  Constitutional: She is oriented to  person, place, and time. She appears well-developed and well-nourished.  HENT:  Head: Atraumatic.  Eyes: Conjunctivae are normal. Pupils are equal, round, and reactive to light. No scleral icterus.  Neck: Normal range of motion. Neck supple.  Cardiovascular: Normal rate, regular rhythm and normal heart sounds.   Pulmonary/Chest: Effort normal and breath sounds normal. No respiratory distress.  Abdominal: Soft. Bowel sounds are normal. There is no tenderness.  Musculoskeletal: Normal range of motion.  Lymphadenopathy:    She has no cervical adenopathy.  Neurological: She is alert and oriented to person, place, and time. No cranial nerve deficit. She exhibits normal muscle tone. Coordination normal.  Skin: Skin is warm and dry.  Psychiatric: She has a normal mood and affect. Her behavior is normal.  Nursing note and vitals reviewed.   Results for orders placed or performed in visit on 03/11/16  Microscopic Examination  Result Value Ref Range   WBC, UA 11-30 (A) 0 - 5 /hpf   RBC, UA 0-2 0 - 2 /hpf   Epithelial Cells (non renal) >10 (A) 0 - 10 /hpf   Bacteria, UA Few None seen/Few  UA/M w/rflx Culture, Routine  Result Value Ref Range   Specific Gravity, UA 1.010 1.005 - 1.030   pH, UA 6.0 5.0 - 7.5  Color, UA Yellow Yellow   Appearance Ur Cloudy (A) Clear   Leukocytes, UA 2+ (A) Negative   Protein, UA Negative Negative/Trace   Glucose, UA Negative Negative   Ketones, UA Negative Negative   RBC, UA Trace (A) Negative   Bilirubin, UA Negative Negative   Urobilinogen, Ur 0.2 0.2 - 1.0 mg/dL   Nitrite, UA Negative Negative   Microscopic Examination See below:    Urinalysis Reflex Comment   Urine Culture, Routine  Result Value Ref Range   Urine Culture, Routine WILL FOLLOW       Assessment & Plan:   Problem List Items Addressed This Visit    None    Visit Diagnoses    Fever, unspecified fever cause    -  Primary   Await CBC, CMP, U/A. Drink lots of water, tylenol or  ibuprofen for fevers. Monitor closely for changes.    Relevant Orders   CBC with Differential/Platelet   Comprehensive metabolic panel   UA/M w/rflx Culture, Routine (Completed)       Follow up plan: Return if symptoms worsen or fail to improve.

## 2016-03-11 NOTE — Patient Instructions (Signed)
Follow up as needed

## 2016-03-12 LAB — CBC WITH DIFFERENTIAL/PLATELET
BASOS ABS: 0 10*3/uL (ref 0.0–0.2)
Basos: 0 %
EOS (ABSOLUTE): 0.1 10*3/uL (ref 0.0–0.4)
EOS: 1 %
HEMATOCRIT: 41.1 % (ref 34.0–46.6)
HEMOGLOBIN: 14.5 g/dL (ref 11.1–15.9)
IMMATURE GRANULOCYTES: 1 %
Immature Grans (Abs): 0.1 10*3/uL (ref 0.0–0.1)
LYMPHS: 24 %
Lymphocytes Absolute: 2.2 10*3/uL (ref 0.7–3.1)
MCH: 31.3 pg (ref 26.6–33.0)
MCHC: 35.3 g/dL (ref 31.5–35.7)
MCV: 89 fL (ref 79–97)
MONOCYTES: 7 %
Monocytes Absolute: 0.7 10*3/uL (ref 0.1–0.9)
NEUTROS PCT: 67 %
Neutrophils Absolute: 6.2 10*3/uL (ref 1.4–7.0)
Platelets: 210 10*3/uL (ref 150–379)
RBC: 4.63 x10E6/uL (ref 3.77–5.28)
RDW: 12.6 % (ref 12.3–15.4)
WBC: 9.3 10*3/uL (ref 3.4–10.8)

## 2016-03-12 LAB — COMPREHENSIVE METABOLIC PANEL
ALBUMIN: 4.4 g/dL (ref 3.5–5.5)
ALT: 17 IU/L (ref 0–32)
AST: 16 IU/L (ref 0–40)
Albumin/Globulin Ratio: 2 (ref 1.2–2.2)
Alkaline Phosphatase: 74 IU/L (ref 39–117)
BUN / CREAT RATIO: 8 — AB (ref 9–23)
BUN: 6 mg/dL (ref 6–20)
Bilirubin Total: 0.5 mg/dL (ref 0.0–1.2)
CALCIUM: 9.3 mg/dL (ref 8.7–10.2)
CO2: 22 mmol/L (ref 18–29)
CREATININE: 0.77 mg/dL (ref 0.57–1.00)
Chloride: 104 mmol/L (ref 96–106)
GFR calc Af Amer: 116 mL/min/{1.73_m2} (ref >59)
GFR, EST NON AFRICAN AMERICAN: 100 mL/min/{1.73_m2} (ref >59)
GLOBULIN, TOTAL: 2.2 g/dL (ref 1.5–4.5)
GLUCOSE: 84 mg/dL (ref 65–99)
Potassium: 4.3 mmol/L (ref 3.5–5.2)
SODIUM: 139 mmol/L (ref 134–144)
Total Protein: 6.6 g/dL (ref 6.0–8.5)

## 2016-03-13 LAB — UA/M W/RFLX CULTURE, ROUTINE
BILIRUBIN UA: NEGATIVE
Glucose, UA: NEGATIVE
KETONES UA: NEGATIVE
NITRITE UA: NEGATIVE
Protein, UA: NEGATIVE
Specific Gravity, UA: 1.01 (ref 1.005–1.030)
UUROB: 0.2 mg/dL (ref 0.2–1.0)
pH, UA: 6 (ref 5.0–7.5)

## 2016-03-13 LAB — URINE CULTURE, REFLEX

## 2016-03-13 LAB — MICROSCOPIC EXAMINATION

## 2016-03-14 ENCOUNTER — Encounter: Payer: Self-pay | Admitting: Family Medicine

## 2016-05-04 ENCOUNTER — Ambulatory Visit (INDEPENDENT_AMBULATORY_CARE_PROVIDER_SITE_OTHER): Payer: Managed Care, Other (non HMO)

## 2016-05-04 ENCOUNTER — Other Ambulatory Visit: Payer: Managed Care, Other (non HMO)

## 2016-05-04 DIAGNOSIS — Z3042 Encounter for surveillance of injectable contraceptive: Secondary | ICD-10-CM

## 2016-05-04 MED ORDER — MEDROXYPROGESTERONE ACETATE 150 MG/ML IM SUSP
150.0000 mg | Freq: Once | INTRAMUSCULAR | Status: AC
  Administered 2016-05-04: 150 mg via INTRAMUSCULAR

## 2016-05-05 LAB — PREGNANCY, URINE: Preg Test, Ur: NEGATIVE

## 2016-05-12 ENCOUNTER — Ambulatory Visit: Payer: Managed Care, Other (non HMO) | Admitting: Family Medicine

## 2016-05-19 ENCOUNTER — Ambulatory Visit: Payer: Managed Care, Other (non HMO)

## 2016-05-19 ENCOUNTER — Ambulatory Visit (INDEPENDENT_AMBULATORY_CARE_PROVIDER_SITE_OTHER): Payer: Managed Care, Other (non HMO)

## 2016-05-19 DIAGNOSIS — S91339A Puncture wound without foreign body, unspecified foot, initial encounter: Secondary | ICD-10-CM

## 2016-05-19 DIAGNOSIS — Z23 Encounter for immunization: Secondary | ICD-10-CM

## 2016-05-20 ENCOUNTER — Ambulatory Visit: Payer: Managed Care, Other (non HMO) | Admitting: Family Medicine

## 2016-05-23 ENCOUNTER — Encounter: Payer: Self-pay | Admitting: Family Medicine

## 2016-05-23 ENCOUNTER — Ambulatory Visit (INDEPENDENT_AMBULATORY_CARE_PROVIDER_SITE_OTHER): Payer: Managed Care, Other (non HMO) | Admitting: Family Medicine

## 2016-05-23 VITALS — BP 108/76 | HR 72 | Temp 98.3°F | Wt 218.0 lb

## 2016-05-23 DIAGNOSIS — G4489 Other headache syndrome: Secondary | ICD-10-CM

## 2016-05-23 DIAGNOSIS — G43009 Migraine without aura, not intractable, without status migrainosus: Secondary | ICD-10-CM | POA: Diagnosis not present

## 2016-05-23 MED ORDER — ONDANSETRON 4 MG PO TBDP: 4.0000 mg | ORAL_TABLET | Freq: Three times a day (TID) | ORAL | 0 refills | Status: DC | PRN

## 2016-05-23 MED ORDER — SUMATRIPTAN SUCCINATE 6 MG/0.5ML ~~LOC~~ SOLN
6.0000 mg | Freq: Once | SUBCUTANEOUS | Status: AC
  Administered 2016-05-23: 6 mg via SUBCUTANEOUS

## 2016-05-23 NOTE — Patient Instructions (Signed)
Follow up as needed

## 2016-05-23 NOTE — Progress Notes (Signed)
BP 108/76   Pulse 72   Temp 98.3 F (36.8 C)   Wt 218 lb (98.9 kg)   SpO2 99%   BMI 36.61 kg/m    Subjective:    Patient ID: Katrina Long, female    DOB: 08/23/1980, 35 y.o.   MRN: [id number]  HPI: Katrina CheekDawn M Mcclune is a 35 y.o. female  Chief Complaint  Patient presents with  . Headache    started this morning. took 2 -600mg  Ibuprofen and headache has not resolved  . Emesis    started this morning, can't keep water down   Patient presents with several hours of severe right sided HA, photophobia, sweats, chills, and N/V. States she woke up suddenly around 3 am with sxs and can't even keep water down since. Has tried taking 1200 mg ibuprofen with no relief, which usually works per patient. Has been trying to sleep if off but states nothing is making it go away. Denies visual changes, dizziness, CP. Denies fever, aches, diarrhea, abdominal pain.  Past Medical History:  Diagnosis Date  . Allergy   . Anemia   . History of abnormal cervical Pap smear   . Hypoglobulinemia   . 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 Marital status: Single    Spouse name: N/A  . Number of children: N/A  . Years of education: N/A   Occupational History  . Not on file.   Social History Main Topics  . Smoking status: Never Smoker  . Smokeless tobacco: Never Used  . Alcohol use No  . Drug use: No  . Sexual activity: Yes   Other Topics Concern  . Not on file   Social History Narrative  . No narrative on file    Relevant past medical, surgical, family and social history reviewed and updated as indicated. Interim medical history since our last visit reviewed. Allergies and medications reviewed and updated.  Review of Systems  Constitutional: Positive for chills and diaphoresis.  HENT: Negative.   Eyes: Positive for photophobia.  Respiratory: Negative.   Cardiovascular: Negative.   Gastrointestinal: Positive for nausea and vomiting.  Genitourinary: Negative.     Musculoskeletal: Negative.   Skin: Negative.   Neurological: Positive for headaches.  Psychiatric/Behavioral: Negative.     Per HPI unless specifically indicated above     Objective:    BP 108/76   Pulse 72   Temp 98.3 F (36.8 C)   Wt 218 lb (98.9 kg)   SpO2 99%   BMI 36.61 kg/m   Wt Readings from Last 3 Encounters:  05/23/16 218 lb (98.9 kg)  03/11/16 215 lb (97.5 kg)  11/10/15 217 lb (98.4 kg)    Physical Exam  Constitutional: She is oriented to person, place, and time. She appears well-developed and well-nourished. No distress.  HENT:  Head: Atraumatic.  Right Ear: External ear normal.  Left Ear: External ear normal.  Nose: Nose normal.  Mouth/Throat: Oropharynx is clear and moist.  Eyes: Conjunctivae and EOM are normal. Pupils are equal, round, and reactive to light. No scleral icterus.  Neck: Normal range of motion. Neck supple.  Cardiovascular: Normal rate and normal heart sounds.   Pulmonary/Chest: Effort normal and breath sounds normal. No respiratory distress.  Abdominal: Soft. Bowel sounds are normal. There is no tenderness.  Musculoskeletal: Normal range of motion.  Neurological: She is alert and oriented to person, place, and time. No cranial nerve deficit. She exhibits normal muscle tone. Coordination normal.  Skin: Skin is warm and dry.  Psychiatric: She has a normal mood and affect. Her behavior is normal.  Nursing note and vitals reviewed.     Assessment & Plan:   Problem List Items Addressed This Visit    None    Visit Diagnoses    Migraine without aura and without status migrainosus, not intractable    -  Primary   Will treat with IM imitrex and zofran. Rest, dark room, quiet. tylenol as needed for pain relief.    Relevant Medications   SUMAtriptan (IMITREX) injection 6 mg (Completed)       Follow up plan: Return if symptoms worsen or fail to improve.

## 2016-07-25 ENCOUNTER — Ambulatory Visit: Payer: Managed Care, Other (non HMO)

## 2016-07-29 ENCOUNTER — Ambulatory Visit (INDEPENDENT_AMBULATORY_CARE_PROVIDER_SITE_OTHER): Payer: Managed Care, Other (non HMO)

## 2016-07-29 DIAGNOSIS — Z3042 Encounter for surveillance of injectable contraceptive: Secondary | ICD-10-CM | POA: Diagnosis not present

## 2016-07-29 MED ORDER — MEDROXYPROGESTERONE ACETATE 150 MG/ML IM SUSP
150.0000 mg | Freq: Once | INTRAMUSCULAR | Status: AC
  Administered 2016-07-29: 150 mg via INTRAMUSCULAR

## 2016-10-17 ENCOUNTER — Ambulatory Visit (INDEPENDENT_AMBULATORY_CARE_PROVIDER_SITE_OTHER): Payer: 59

## 2016-10-17 DIAGNOSIS — Z3042 Encounter for surveillance of injectable contraceptive: Secondary | ICD-10-CM

## 2016-10-17 MED ORDER — MEDROXYPROGESTERONE ACETATE 150 MG/ML IM SUSP
150.0000 mg | Freq: Once | INTRAMUSCULAR | Status: AC
  Administered 2016-10-17: 150 mg via INTRAMUSCULAR

## 2017-03-13 ENCOUNTER — Other Ambulatory Visit: Payer: Self-pay | Admitting: Family Medicine

## 2017-03-14 ENCOUNTER — Telehealth: Payer: Self-pay

## 2017-03-14 MED ORDER — KETOTIFEN FUMARATE 0.025 % OP SOLN: OPHTHALMIC | 11 refills | Status: DC

## 2017-03-14 NOTE — Telephone Encounter (Signed)
Was written by you on 11/10/15 with 11 refills, it is otc, but if she has a prescription she can use her flex card.

## 2017-03-14 NOTE — Telephone Encounter (Signed)
Ketotifen 0.025% opth solution

## 2017-03-14 NOTE — Telephone Encounter (Signed)
I don't write that. It must be from her eye doctor

## 2017-03-20 ENCOUNTER — Telehealth: Payer: Self-pay

## 2017-03-20 MED ORDER — TRIAMCINOLONE ACETONIDE 55 MCG/ACT NA AERO: 2.0000 | INHALATION_SPRAY | Freq: Every day | NASAL | 11 refills | Status: DC

## 2017-03-20 NOTE — Telephone Encounter (Signed)
Please send a new prescription Nasacort   [facility] [address]

## 2017-05-11 ENCOUNTER — Encounter: Payer: Self-pay | Admitting: Family Medicine

## 2017-05-11 ENCOUNTER — Ambulatory Visit (INDEPENDENT_AMBULATORY_CARE_PROVIDER_SITE_OTHER): Payer: 59 | Admitting: Family Medicine

## 2017-05-11 ENCOUNTER — Other Ambulatory Visit: Payer: Self-pay | Admitting: Family Medicine

## 2017-05-11 VITALS — BP 109/76 | HR 94 | Ht 66.54 in | Wt 225.0 lb

## 2017-05-11 DIAGNOSIS — Z3042 Encounter for surveillance of injectable contraceptive: Secondary | ICD-10-CM | POA: Diagnosis not present

## 2017-05-11 DIAGNOSIS — Z308 Encounter for other contraceptive management: Secondary | ICD-10-CM

## 2017-05-11 DIAGNOSIS — Z0000 Encounter for general adult medical examination without abnormal findings: Secondary | ICD-10-CM | POA: Diagnosis not present

## 2017-05-11 DIAGNOSIS — J309 Allergic rhinitis, unspecified: Secondary | ICD-10-CM

## 2017-05-11 MED ORDER — MONTELUKAST SODIUM 10 MG PO TABS: ORAL_TABLET | ORAL | 3 refills | Status: DC

## 2017-05-11 MED ORDER — MEDROXYPROGESTERONE ACETATE 150 MG/ML IM SUSP
150.0000 mg | INTRAMUSCULAR | Status: DC
  Administered 2017-05-11 – 2021-04-09 (×13): 150 mg via INTRAMUSCULAR

## 2017-05-11 MED ORDER — TRIAMCINOLONE ACETONIDE 55 MCG/ACT NA AERO: 2.0000 | INHALATION_SPRAY | Freq: Every day | NASAL | 11 refills | Status: DC

## 2017-05-11 MED ORDER — ONDANSETRON 4 MG PO TBDP: 4.0000 mg | ORAL_TABLET | Freq: Three times a day (TID) | ORAL | 0 refills | Status: DC | PRN

## 2017-05-11 MED ORDER — CETIRIZINE HCL 10 MG PO TABS: ORAL_TABLET | ORAL | 3 refills | Status: DC

## 2017-05-11 MED ORDER — CRANBERRY 300 MG PO TABS: 300.0000 mg | ORAL_TABLET | Freq: Two times a day (BID) | ORAL | 11 refills | Status: DC

## 2017-05-11 MED ORDER — KETOTIFEN FUMARATE 0.025 % OP SOLN: OPHTHALMIC | 11 refills | Status: DC

## 2017-05-11 MED ORDER — PROBIOTIC-10 PO CHEW: 1.0000 | CHEWABLE_TABLET | Freq: Every day | ORAL | 11 refills | Status: DC

## 2017-05-11 NOTE — Progress Notes (Signed)
BP 109/76   Pulse 94   Ht 5' 6.54" (1.69 m)   Wt 225 lb (102.1 kg)   LMP 05/06/2017   SpO2 98%   Breastfeeding? Unknown   BMI 35.73 kg/m    Subjective:    Patient ID: Katrina Long, female    DOB: 07/22/1980, 36 y.o.   MRN: [id number]  HPI: Katrina Long is a 36 y.o. female presenting on 05/11/2017 for comprehensive medical examination. Current medical complaints include:wanting to get back on depo injections.  Last depo was in May - forgot to do her August injection. Has had unprotected sex once since it being out of her system prior to her remembering that she'd missed a dose.   Depression Screen done today and results listed below:  Depression screen [facility] 2/9 05/11/2017 11/10/2015  Decreased Interest 0 0  Down, Depressed, Hopeless 0 0  PHQ - 2 Score 0 0    The patient does not have a history of falls. I did not complete a risk assessment for falls. A plan of care for falls was not documented.   Past Medical History:  Past Medical History:  Diagnosis Date  . Allergy   . Anemia   . History of abnormal cervical Pap smear   . Hypoglobulinemia   . Hypoproteinemia (HCC)     Surgical History:  No past surgical history on file.  Medications:  No current outpatient medications on file prior to visit.   No current facility-administered medications on file prior to visit.     Allergies:  Allergies  Allergen Reactions  . Entex Lq [Phenylephrine-Guaifenesin] Rash  . Penicillins Rash    Social History:  Social History   Socioeconomic History  . Marital status: Single    Spouse name: Not on file  . Number of children: Not on file  . Years of education: Not on file  . Highest education level: Not on file  Social Needs  . Financial resource strain: Not on file  . Food insecurity - worry: Not on file  . Food insecurity - inability: Not on file  . Transportation needs - medical: Not on file  . Transportation needs - non-medical: Not on file  Occupational History  . Not  on file  Tobacco Use  . Smoking status: Never Smoker  . Smokeless tobacco: Never Used  Substance and Sexual Activity  . Alcohol use: No  . Drug use: No  . Sexual activity: Yes  Other Topics Concern  . Not on file  Social History Narrative  . Not on file   Social History   Tobacco Use  Smoking Status Never Smoker  Smokeless Tobacco Never Used   Social History   Substance and Sexual Activity  Alcohol Use No    Family History:  Family History  Problem Relation Age of Onset  . Mental illness Mother   . Lung disease Mother   . Diabetes Sister   . Mental illness Brother   . Hypertension Maternal Grandmother   . Cancer Maternal Aunt 50       Breast    Past medical history, surgical history, medications, allergies, family history and social history reviewed with patient today and changes made to appropriate areas of the chart.   Review of Systems - General ROS: negative Psychological ROS: negative Ophthalmic ROS: negative ENT ROS: negative Breast ROS: negative for breast lumps Respiratory ROS: no cough, shortness of breath, or wheezing Cardiovascular ROS: no chest pain or dyspnea on exertion Gastrointestinal ROS: no  abdominal pain, change in bowel habits, or black or bloody stools Genito-Urinary ROS: no dysuria, trouble voiding, or hematuria Musculoskeletal ROS: negative Neurological ROS: no TIA or stroke symptoms Dermatological ROS: negative All other ROS negative except what is listed above and in the HPI.      Objective:    BP 109/76   Pulse 94   Ht 5' 6.54" (1.69 m)   Wt 225 lb (102.1 kg)   LMP 05/06/2017   SpO2 98%   Breastfeeding? Unknown   BMI 35.73 kg/m   Wt Readings from Last 3 Encounters:  05/11/17 225 lb (102.1 kg)  05/23/16 218 lb (98.9 kg)  03/11/16 215 lb (97.5 kg)    Physical Exam  Constitutional: She is oriented to person, place, and time. She appears well-developed and well-nourished. No distress.  HENT:  Head: Atraumatic.  Right Ear:  External ear normal.  Left Ear: External ear normal.  Nose: Nose normal.  Mouth/Throat: Oropharynx is clear and moist. No oropharyngeal exudate.  Eyes: Conjunctivae are normal. Pupils are equal, round, and reactive to light. No scleral icterus.  Neck: Normal range of motion. Neck supple. No thyromegaly present.  Cardiovascular: Normal rate, regular rhythm, normal heart sounds and intact distal pulses.  Pulmonary/Chest: Effort normal and breath sounds normal. No respiratory distress. Right breast exhibits no mass, no skin change and no tenderness. Left breast exhibits no mass, no skin change and no tenderness.  Abdominal: Soft. Bowel sounds are normal. She exhibits no mass. There is no tenderness.  Musculoskeletal: Normal range of motion. She exhibits no edema or tenderness.  Lymphadenopathy:    She has no cervical adenopathy.    She has no axillary adenopathy.  Neurological: She is alert and oriented to person, place, and time. No cranial nerve deficit.  Skin: Skin is warm and dry. No rash noted.  Psychiatric: She has a normal mood and affect. Her behavior is normal.  Nursing note and vitals reviewed.   Results for orders placed or performed in visit on 05/11/17  CBC with Differential/Platelet  Result Value Ref Range   WBC 11.8 (H) 3.4 - 10.8 x10E3/uL   RBC 4.63 3.77 - 5.28 x10E6/uL   Hemoglobin 14.3 11.1 - 15.9 g/dL   Hematocrit 41.342.6 24.434.0 - 46.6 %   MCV 92 79 - 97 fL   MCH 30.9 26.6 - 33.0 pg   MCHC 33.6 31.5 - 35.7 g/dL   RDW 01.012.9 27.212.3 - 53.615.4 %   Platelets 291 150 - 379 x10E3/uL   Neutrophils 62 Not Estab. %   Lymphs 29 Not Estab. %   Monocytes 6 Not Estab. %   Eos 2 Not Estab. %   Basos 0 Not Estab. %   Neutrophils Absolute 7.3 (H) 1.4 - 7.0 x10E3/uL   Lymphocytes Absolute 3.5 (H) 0.7 - 3.1 x10E3/uL   Monocytes Absolute 0.7 0.1 - 0.9 x10E3/uL   EOS (ABSOLUTE) 0.2 0.0 - 0.4 x10E3/uL   Basophils Absolute 0.0 0.0 - 0.2 x10E3/uL   Immature Granulocytes 1 Not Estab. %    Immature Grans (Abs) 0.1 0.0 - 0.1 x10E3/uL  Comprehensive metabolic panel  Result Value Ref Range   Glucose 79 65 - 99 mg/dL   BUN 11 6 - 20 mg/dL   Creatinine, Ser 6.440.95 0.57 - 1.00 mg/dL   GFR calc non Af Amer 77 >59 mL/min/1.73   GFR calc Af Amer 89 >59 mL/min/1.73   BUN/Creatinine Ratio 12 9 - 23   Sodium 141 134 - 144 mmol/L  Potassium 4.5 3.5 - 5.2 mmol/L   Chloride 103 96 - 106 mmol/L   CO2 24 20 - 29 mmol/L   Calcium 9.6 8.7 - 10.2 mg/dL   Total Protein 7.0 6.0 - 8.5 g/dL   Albumin 4.6 3.5 - 5.5 g/dL   Globulin, Total 2.4 1.5 - 4.5 g/dL   Albumin/Globulin Ratio 1.9 1.2 - 2.2   Bilirubin Total 0.3 0.0 - 1.2 mg/dL   Alkaline Phosphatase 80 39 - 117 IU/L   AST 15 0 - 40 IU/L   ALT 16 0 - 32 IU/L  Lipid Panel w/o Chol/HDL Ratio  Result Value Ref Range   Cholesterol, Total 171 100 - 199 mg/dL   Triglycerides 409431 (H) 0 - 149 mg/dL   HDL 32 (L) >81>39 mg/dL   VLDL Cholesterol Cal Comment 5 - 40 mg/dL   LDL Calculated Comment 0 - 99 mg/dL  TSH  Result Value Ref Range   TSH 2.250 0.450 - 4.500 uIU/mL      Assessment & Plan:   Problem List Items Addressed This Visit      Respiratory   Allergic rhinitis    Stable on zyrtec, singulair, and nasacort. Continue current regimen       Other Visit Diagnoses    Encounter for other contraceptive management    -  Primary   Urine preg negative, will restart depo. F/u for next injection in 3 months.    Relevant Medications   medroxyPROGESTERone (DEPO-PROVERA) injection 150 mg   Other Relevant Orders   Pregnancy, urine   Annual physical exam       Relevant Orders   UA/M w/rflx Culture, Routine   CBC with Differential/Platelet (Completed)   Comprehensive metabolic panel (Completed)   Lipid Panel w/o Chol/HDL Ratio (Completed)   TSH (Completed)       Follow up plan: Return in about 3 months (around 08/10/2017) for depo.   LABORATORY TESTING:  - Pap smear: up to date  IMMUNIZATIONS:   - Tdap: Tetanus vaccination status  reviewed: last tetanus booster within 10 years. - Influenza: Up to date  PATIENT COUNSELING:   Advised to take 1 mg of folate supplement per day if capable of pregnancy.   Sexuality: Discussed sexually transmitted diseases, partner selection, use of condoms, avoidance of unintended pregnancy  and contraceptive alternatives.   Advised to avoid cigarette smoking.  I discussed with the patient that most people either abstain from alcohol or drink within safe limits (<=14/week and <=4 drinks/occasion for males, <=7/weeks and <= 3 drinks/occasion for females) and that the risk for alcohol disorders and other health effects rises proportionally with the number of drinks per week and how often a drinker exceeds daily limits.  Discussed cessation/primary prevention of drug use and availability of treatment for abuse.   Diet: Encouraged to adjust caloric intake to maintain  or achieve ideal body weight, to reduce intake of dietary saturated fat and total fat, to limit sodium intake by avoiding high sodium foods and not adding table salt, and to maintain adequate dietary potassium and calcium preferably from fresh fruits, vegetables, and low-fat dairy products.    stressed the importance of regular exercise  Injury prevention: Discussed safety belts, safety helmets, smoke detector, smoking near bedding or upholstery.   Dental health: Discussed importance of regular tooth brushing, flossing, and dental visits.    NEXT PREVENTATIVE PHYSICAL DUE IN 1 YEAR. Return in about 3 months (around 08/10/2017) for depo.

## 2017-05-12 LAB — CBC WITH DIFFERENTIAL/PLATELET
BASOS ABS: 0 10*3/uL (ref 0.0–0.2)
BASOS: 0 %
EOS (ABSOLUTE): 0.2 10*3/uL (ref 0.0–0.4)
Eos: 2 %
Hematocrit: 42.6 % (ref 34.0–46.6)
Hemoglobin: 14.3 g/dL (ref 11.1–15.9)
Immature Grans (Abs): 0.1 10*3/uL (ref 0.0–0.1)
Immature Granulocytes: 1 %
Lymphocytes Absolute: 3.5 10*3/uL — ABNORMAL HIGH (ref 0.7–3.1)
Lymphs: 29 %
MCH: 30.9 pg (ref 26.6–33.0)
MCHC: 33.6 g/dL (ref 31.5–35.7)
MCV: 92 fL (ref 79–97)
MONOS ABS: 0.7 10*3/uL (ref 0.1–0.9)
Monocytes: 6 %
NEUTROS ABS: 7.3 10*3/uL — AB (ref 1.4–7.0)
Neutrophils: 62 %
PLATELETS: 291 10*3/uL (ref 150–379)
RBC: 4.63 x10E6/uL (ref 3.77–5.28)
RDW: 12.9 % (ref 12.3–15.4)
WBC: 11.8 10*3/uL — ABNORMAL HIGH (ref 3.4–10.8)

## 2017-05-12 LAB — COMPREHENSIVE METABOLIC PANEL
A/G RATIO: 1.9 (ref 1.2–2.2)
ALK PHOS: 80 IU/L (ref 39–117)
ALT: 16 IU/L (ref 0–32)
AST: 15 IU/L (ref 0–40)
Albumin: 4.6 g/dL (ref 3.5–5.5)
BILIRUBIN TOTAL: 0.3 mg/dL (ref 0.0–1.2)
BUN/Creatinine Ratio: 12 (ref 9–23)
BUN: 11 mg/dL (ref 6–20)
CHLORIDE: 103 mmol/L (ref 96–106)
CO2: 24 mmol/L (ref 20–29)
Calcium: 9.6 mg/dL (ref 8.7–10.2)
Creatinine, Ser: 0.95 mg/dL (ref 0.57–1.00)
GFR calc non Af Amer: 77 mL/min/{1.73_m2} (ref >59)
GFR, EST AFRICAN AMERICAN: 89 mL/min/{1.73_m2} (ref >59)
GLUCOSE: 79 mg/dL (ref 65–99)
Globulin, Total: 2.4 g/dL (ref 1.5–4.5)
POTASSIUM: 4.5 mmol/L (ref 3.5–5.2)
Sodium: 141 mmol/L (ref 134–144)
TOTAL PROTEIN: 7 g/dL (ref 6.0–8.5)

## 2017-05-12 LAB — LIPID PANEL W/O CHOL/HDL RATIO
CHOLESTEROL TOTAL: 171 mg/dL (ref 100–199)
HDL: 32 mg/dL — AB (ref >39)
Triglycerides: 431 mg/dL — ABNORMAL HIGH (ref 0–149)

## 2017-05-12 LAB — TSH: TSH: 2.25 u[IU]/mL (ref 0.450–4.500)

## 2017-05-13 LAB — UA/M W/RFLX CULTURE, ROUTINE
BILIRUBIN UA: NEGATIVE
GLUCOSE, UA: NEGATIVE
Ketones, UA: NEGATIVE
NITRITE UA: NEGATIVE
Protein, UA: NEGATIVE
SPEC GRAV UA: 1.025 (ref 1.005–1.030)
Urobilinogen, Ur: 1 mg/dL (ref 0.2–1.0)
pH, UA: 6.5 (ref 5.0–7.5)

## 2017-05-13 LAB — URINE CULTURE

## 2017-05-13 LAB — MICROSCOPIC EXAMINATION

## 2017-05-13 LAB — PREGNANCY, URINE: PREG TEST UR: NEGATIVE

## 2017-05-14 DIAGNOSIS — J309 Allergic rhinitis, unspecified: Secondary | ICD-10-CM | POA: Insufficient documentation

## 2017-05-14 NOTE — Assessment & Plan Note (Signed)
Stable on zyrtec, singulair, and nasacort. Continue current regimen

## 2017-05-15 LAB — UA/M W/RFLX CULTURE, ROUTINE
Bilirubin, UA: NEGATIVE
Glucose, UA: NEGATIVE
Ketones, UA: NEGATIVE
Nitrite, UA: NEGATIVE
Protein, UA: NEGATIVE
SPEC GRAV UA: 1.025 (ref 1.005–1.030)
Urobilinogen, Ur: 1 mg/dL (ref 0.2–1.0)
pH, UA: 6.5 (ref 5.0–7.5)

## 2017-05-15 LAB — MICROSCOPIC EXAMINATION

## 2017-05-15 LAB — PREGNANCY, URINE: Preg Test, Ur: NEGATIVE

## 2017-05-15 LAB — URINE CULTURE

## 2017-07-31 ENCOUNTER — Ambulatory Visit (INDEPENDENT_AMBULATORY_CARE_PROVIDER_SITE_OTHER): Payer: 59

## 2017-07-31 DIAGNOSIS — Z3042 Encounter for surveillance of injectable contraceptive: Secondary | ICD-10-CM | POA: Diagnosis not present

## 2017-07-31 DIAGNOSIS — Z308 Encounter for other contraceptive management: Secondary | ICD-10-CM | POA: Diagnosis not present

## 2017-10-20 ENCOUNTER — Ambulatory Visit (INDEPENDENT_AMBULATORY_CARE_PROVIDER_SITE_OTHER): Payer: 59

## 2017-10-20 DIAGNOSIS — Z308 Encounter for other contraceptive management: Secondary | ICD-10-CM

## 2017-10-20 DIAGNOSIS — Z3042 Encounter for surveillance of injectable contraceptive: Secondary | ICD-10-CM | POA: Diagnosis not present

## 2017-10-27 ENCOUNTER — Ambulatory Visit: Payer: Self-pay | Admitting: *Deleted

## 2017-10-27 NOTE — Telephone Encounter (Signed)
Agree with below. Thanks. 

## 2017-10-27 NOTE — Telephone Encounter (Signed)
Pt called with complaints of pain in left side above pelvis, yesterday she moved and it shot pain all the way up to her shoulder; she says the pain is a constant dull pain but when she takes a deep breath it is painful; she also reports a temperature 101.0 at 0800 today; recommendations made per nurse triage to include seeing a physician within 4 hours; the pt normally sees Dr Dossie Arbour, but there is no availability in the office within the guidelines set per nurse triage; pt states that she will go to urgent care; will route to office for notification of this encounter.   Reason for Disposition . [1] MILD-MODERATE pain AND [2] constant AND [3] present > 2 hours  Answer Assessment - Initial Assessment Questions 1. LOCATION: "Where does it hurt?"      Left side above above pelvis 2. RADIATION: "Does the pain shoot anywhere else?" (e.g., chest, back)     Yes up to shoulder 3. ONSET: "When did the pain begin?" (e.g., minutes, hours or days ago)      10/26/17 4. SUDDEN: "Gradual or sudden onset?"     sudden 5. PATTERN "Does the pain come and go, or is it constant?"    - If constant: "Is it getting better, staying the same, or worsening?"      (Note: Constant means the pain never goes away completely; most serious pain is constant and it progresses)     - If intermittent: "How long does it last?" "Do you have pain now?"     (Note: Intermittent means the pain goes away completely between bouts)     Constant dull; stil having pain 6. SEVERITY: "How bad is the pain?"  (e.g., Scale 1-10; mild, moderate, or severe)   - MILD (1-3): doesn't interfere with normal activities, abdomen soft and not tender to touch    - MODERATE (4-7): interferes with normal activities or awakens from sleep, tender to touch    - SEVERE (8-10): excruciating pain, doubled over, unable to do any normal activities      4-7 out of 10 7. RECURRENT SYMPTOM: "Have you ever had this type of abdominal pain before?" If so, ask: "When was  the last time?" and "What happened that time?"      no 8. CAUSE: "What do you think is causing the abdominal pain?"     Not sure 9. RELIEVING/AGGRAVATING FACTORS: "What makes it better or worse?" (e.g., movement, antacids, bowel movement)     Moving makes it worse or taking a deep breath 10. OTHER SYMPTOMS: "Has there been any vomiting, diarrhea, constipation, or urine problems?"      Fever 101.0 11. PREGNANCY: "Is there any chance you are pregnant?" "When was your last menstrual period?"       No Depo Provera  Protocols used: ABDOMINAL PAIN - [facility]

## 2018-01-12 ENCOUNTER — Ambulatory Visit (INDEPENDENT_AMBULATORY_CARE_PROVIDER_SITE_OTHER): Payer: 59

## 2018-01-12 DIAGNOSIS — Z308 Encounter for other contraceptive management: Secondary | ICD-10-CM

## 2018-01-12 DIAGNOSIS — Z3042 Encounter for surveillance of injectable contraceptive: Secondary | ICD-10-CM

## 2018-01-12 NOTE — Progress Notes (Signed)
Next Depo Injection Due October 18th-April 13, 2018.

## 2018-03-30 ENCOUNTER — Ambulatory Visit: Payer: 59

## 2018-04-28 ENCOUNTER — Other Ambulatory Visit: Payer: Self-pay | Admitting: Family Medicine

## 2018-04-30 ENCOUNTER — Ambulatory Visit (INDEPENDENT_AMBULATORY_CARE_PROVIDER_SITE_OTHER): Payer: 59

## 2018-04-30 DIAGNOSIS — Z308 Encounter for other contraceptive management: Secondary | ICD-10-CM

## 2018-04-30 DIAGNOSIS — Z3042 Encounter for surveillance of injectable contraceptive: Secondary | ICD-10-CM

## 2018-04-30 MED ORDER — ONDANSETRON 4 MG PO TBDP: 4.0000 mg | ORAL_TABLET | Freq: Three times a day (TID) | ORAL | 0 refills | Status: DC | PRN

## 2018-05-01 LAB — PREGNANCY, URINE: PREG TEST UR: NEGATIVE

## 2018-05-29 ENCOUNTER — Ambulatory Visit (INDEPENDENT_AMBULATORY_CARE_PROVIDER_SITE_OTHER): Payer: 59 | Admitting: Family Medicine

## 2018-05-29 ENCOUNTER — Encounter: Payer: Self-pay | Admitting: Family Medicine

## 2018-05-29 VITALS — BP 102/74 | HR 94 | Temp 99.6°F | Ht 64.88 in | Wt 222.0 lb

## 2018-05-29 DIAGNOSIS — Z9109 Other allergy status, other than to drugs and biological substances: Secondary | ICD-10-CM | POA: Diagnosis not present

## 2018-05-29 DIAGNOSIS — Z3042 Encounter for surveillance of injectable contraceptive: Secondary | ICD-10-CM | POA: Diagnosis not present

## 2018-05-29 DIAGNOSIS — Z0000 Encounter for general adult medical examination without abnormal findings: Secondary | ICD-10-CM

## 2018-05-29 MED ORDER — ONDANSETRON 4 MG PO TBDP: 4.0000 mg | ORAL_TABLET | Freq: Three times a day (TID) | ORAL | 6 refills | Status: DC | PRN

## 2018-05-29 MED ORDER — CETIRIZINE HCL 10 MG PO TABS: ORAL_TABLET | ORAL | 3 refills | Status: DC

## 2018-05-29 MED ORDER — MONTELUKAST SODIUM 10 MG PO TABS: ORAL_TABLET | ORAL | 3 refills | Status: DC

## 2018-05-29 MED ORDER — MEDROXYPROGESTERONE ACETATE 150 MG/ML IM SUSP
150.0000 mg | INTRAMUSCULAR | Status: AC
  Administered 2018-07-23 – 2019-04-05 (×3): 150 mg via INTRAMUSCULAR

## 2018-05-29 MED ORDER — TRIAMCINOLONE ACETONIDE 55 MCG/ACT NA AERO: 2.0000 | INHALATION_SPRAY | Freq: Every day | NASAL | 4 refills | Status: DC

## 2018-05-29 NOTE — Assessment & Plan Note (Signed)
Stable on current regimen. Refills given. Call with any concerns.

## 2018-05-29 NOTE — Assessment & Plan Note (Signed)
Continue depo. Orders for this year in. Call with any concerns.

## 2018-05-29 NOTE — Progress Notes (Signed)
BP 102/74   Pulse 94   Temp 99.6 F (37.6 C) (Oral)   Ht 5' 4.88" (1.648 m)   Wt 222 lb (100.7 kg)   SpO2 98%   BMI 37.08 kg/m    Subjective:    Patient ID: Katrina Long, female    DOB: 10/26/1980, 37 y.o.   MRN: [id number]  HPI: Katrina Long is a 37 y.o. female presenting on 05/29/2018 for comprehensive medical examination. Current medical complaints include:none  Menopausal Symptoms: no  Depression Screen done today and results listed below:  Depression screen [facility] 2/9 05/29/2018 05/11/2017 11/10/2015  Decreased Interest 2 0 0  Down, Depressed, Hopeless 2 0 0  PHQ - 2 Score 4 0 0  Altered sleeping 1 - -  Tired, decreased energy 2 - -  Change in appetite 0 - -  Feeling bad or failure about yourself  1 - -  Trouble concentrating 0 - -  Moving slowly or fidgety/restless 0 - -  Suicidal thoughts 0 - -  PHQ-9 Score 8 - -  Difficult doing work/chores Somewhat difficult - -   GAD 7 : Generalized Anxiety Score 05/29/2018  Nervous, Anxious, on Edge 1  Control/stop worrying 2  Worry too much - different things 2  Trouble relaxing 1  Restless 0  Easily annoyed or irritable 2  Afraid - awful might happen 0  Total GAD 7 Score 8  Anxiety Difficulty Not difficult at all    Past Medical History:  Past Medical History:  Diagnosis Date  . Allergy   . Anemia   . History of abnormal cervical Pap smear   . Hypoglobulinemia   . Hypoproteinemia (HCC)     Surgical History:  History reviewed. No pertinent surgical history.  Medications:  Current Outpatient Medications on File Prior to Visit  Medication Sig  . Cranberry 300 MG tablet Take 1 tablet (300 mg total) by mouth 2 (two) times daily.  Marland Kitchen ketotifen (ZADITOR) 0.025 % ophthalmic solution INSTILL 1 DROP INTO BOTH EYES EVERY DAY  . Probiotic Product (PROBIOTIC-10) CHEW Chew 1 Dose by mouth daily.   Current Facility-Administered Medications on File Prior to Visit  Medication  . medroxyPROGESTERone (DEPO-PROVERA) injection 150  mg    Allergies:  Allergies  Allergen Reactions  . Amoxicillin Rash  . Entex Lq [Phenylephrine-Guaifenesin] Rash  . Penicillins Rash    Social History:  Social History   Socioeconomic History  . Marital status: Single    Spouse name: Not on file  . Number of children: Not on file  . Years of education: Not on file  . Highest education level: Not on file  Occupational History  . Not on file  Social Needs  . Financial resource strain: Not on file  . Food insecurity:    Worry: Not on file    Inability: Not on file  . Transportation needs:    Medical: Not on file    Non-medical: Not on file  Tobacco Use  . Smoking status: Never Smoker  . Smokeless tobacco: Never Used  Substance and Sexual Activity  . Alcohol use: No  . Drug use: No  . Sexual activity: Yes  Lifestyle  . Physical activity:    Days per week: Not on file    Minutes per session: Not on file  . Stress: Not on file  Relationships  . Social connections:    Talks on phone: Not on file    Gets together: Not on file    Attends  religious service: Not on file    Active member of club or organization: Not on file    Attends meetings of clubs or organizations: Not on file    Relationship status: Not on file  . Intimate partner violence:    Fear of current or ex partner: Not on file    Emotionally abused: Not on file    Physically abused: Not on file    Forced sexual activity: Not on file  Other Topics Concern  . Not on file  Social History Narrative  . Not on file   Social History   Tobacco Use  Smoking Status Never Smoker  Smokeless Tobacco Never Used   Social History   Substance and Sexual Activity  Alcohol Use No    Family History:  Family History  Problem Relation Age of Onset  . Mental illness Mother   . Lung disease Mother   . Diabetes Sister   . Mental illness Brother   . Hypertension Maternal Grandmother   . Cancer Maternal Aunt 50       Breast    Past medical history, surgical  history, medications, allergies, family history and social history reviewed with patient today and changes made to appropriate areas of the chart.   Review of Systems  Constitutional: Negative.   HENT: Negative.   Eyes: Negative.   Respiratory: Negative.   Cardiovascular: Negative.   Gastrointestinal: Positive for heartburn (occasionally). Negative for abdominal pain, blood in stool, constipation, diarrhea, melena, nausea and vomiting.  Genitourinary: Negative.   Musculoskeletal: Positive for falls and joint pain. Negative for back pain, myalgias and neck pain.  Skin: Negative.   Neurological: Negative.   Endo/Heme/Allergies: Positive for environmental allergies. Negative for polydipsia. Does not bruise/bleed easily.  Psychiatric/Behavioral: Negative.     All other ROS negative except what is listed above and in the HPI.      Objective:    BP 102/74   Pulse 94   Temp 99.6 F (37.6 C) (Oral)   Ht 5' 4.88" (1.648 m)   Wt 222 lb (100.7 kg)   SpO2 98%   BMI 37.08 kg/m   Wt Readings from Last 3 Encounters:  05/29/18 222 lb (100.7 kg)  05/11/17 225 lb (102.1 kg)  05/23/16 218 lb (98.9 kg)    Physical Exam Vitals signs and nursing note reviewed. Exam conducted with a chaperone present.  Constitutional:      General: She is not in acute distress.    Appearance: Normal appearance. She is obese. She is not ill-appearing, toxic-appearing or diaphoretic.  HENT:     Head: Normocephalic and atraumatic.     Right Ear: Tympanic membrane, ear canal and external ear normal. There is no impacted cerumen.     Left Ear: Tympanic membrane, ear canal and external ear normal. There is no impacted cerumen.     Nose: Nose normal. No congestion or rhinorrhea.     Mouth/Throat:     Mouth: Mucous membranes are moist.     Pharynx: Oropharynx is clear. No oropharyngeal exudate or posterior oropharyngeal erythema.  Eyes:     General: No scleral icterus.       Right eye: No discharge.        Left  eye: No discharge.     Extraocular Movements: Extraocular movements intact.     Conjunctiva/sclera: Conjunctivae normal.     Pupils: Pupils are equal, round, and reactive to light.  Neck:     Musculoskeletal: Normal range of motion and neck  supple. No neck rigidity or muscular tenderness.     Vascular: No carotid bruit.  Cardiovascular:     Rate and Rhythm: Normal rate and regular rhythm.     Pulses: Normal pulses.     Heart sounds: No murmur. No friction rub. No gallop.   Pulmonary:     Effort: Pulmonary effort is normal. No respiratory distress.     Breath sounds: Normal breath sounds. No stridor. No wheezing, rhonchi or rales.  Chest:     Chest wall: No tenderness.     Breasts:        Right: Normal. No swelling, bleeding, inverted nipple, mass, nipple discharge, skin change or tenderness.        Left: Normal. No swelling, bleeding, inverted nipple, mass, nipple discharge, skin change or tenderness.  Abdominal:     General: Abdomen is flat. Bowel sounds are normal. There is no distension.     Palpations: Abdomen is soft. There is no mass.     Tenderness: There is no abdominal tenderness. There is no right CVA tenderness, left CVA tenderness, guarding or rebound.     Hernia: No hernia is present.  Genitourinary:    Comments: Pelvic exams deferred with shared decision making Musculoskeletal:        General: No swelling, tenderness, deformity or signs of injury.     Right lower leg: No edema.     Left lower leg: No edema.  Lymphadenopathy:     Cervical: No cervical adenopathy.  Skin:    General: Skin is warm and dry.     Capillary Refill: Capillary refill takes less than 2 seconds.     Coloration: Skin is not jaundiced or pale.     Findings: No bruising, erythema, lesion or rash.  Neurological:     General: No focal deficit present.     Mental Status: She is alert and oriented to person, place, and time. Mental status is at baseline.     Cranial Nerves: No cranial nerve  deficit.     Sensory: No sensory deficit.     Motor: No weakness.     Coordination: Coordination normal.     Gait: Gait normal.     Deep Tendon Reflexes: Reflexes normal.  Psychiatric:        Mood and Affect: Mood normal.        Behavior: Behavior normal.        Thought Content: Thought content normal.        Judgment: Judgment normal.     Results for orders placed or performed in visit on 04/30/18  Pregnancy, urine  Result Value Ref Range   Preg Test, Ur Negative Negative      Assessment & Plan:   Problem List Items Addressed This Visit      Other   Contraceptive surveillance    Continue depo. Orders for this year in. Call with any concerns.       Relevant Medications   medroxyPROGESTERone (DEPO-PROVERA) injection 150 mg (Start on 07/23/2018 12:00 AM)   Environmental allergies    Stable on current regimen. Refills given. Call with any concerns.        Other Visit Diagnoses    Routine general medical examination at a health care facility    -  Primary   Vaccines up to date. Pap up to date. Screening labs checked today. Continue diet and exercise. Call with any concerns.    Relevant Orders   CBC with Differential/Platelet   Comprehensive metabolic panel  Lipid Panel w/o Chol/HDL Ratio   TSH   UA/M w/rflx Culture, Routine       Follow up plan: Return in about 1 year (around 05/30/2019) for Physical.   LABORATORY TESTING:  - Pap smear: up to date  IMMUNIZATIONS:   - Tdap: Tetanus vaccination status reviewed: last tetanus booster within 10 years. - Influenza: Up to date  PATIENT COUNSELING:   Advised to take 1 mg of folate supplement per day if capable of pregnancy.   Sexuality: Discussed sexually transmitted diseases, partner selection, use of condoms, avoidance of unintended pregnancy  and contraceptive alternatives.   Advised to avoid cigarette smoking.  I discussed with the patient that most people either abstain from alcohol or drink within safe  limits (<=14/week and <=4 drinks/occasion for males, <=7/weeks and <= 3 drinks/occasion for females) and that the risk for alcohol disorders and other health effects rises proportionally with the number of drinks per week and how often a drinker exceeds daily limits.  Discussed cessation/primary prevention of drug use and availability of treatment for abuse.   Diet: Encouraged to adjust caloric intake to maintain  or achieve ideal body weight, to reduce intake of dietary saturated fat and total fat, to limit sodium intake by avoiding high sodium foods and not adding table salt, and to maintain adequate dietary potassium and calcium preferably from fresh fruits, vegetables, and low-fat dairy products.    stressed the importance of regular exercise  Injury prevention: Discussed safety belts, safety helmets, smoke detector, smoking near bedding or upholstery.   Dental health: Discussed importance of regular tooth brushing, flossing, and dental visits.    NEXT PREVENTATIVE PHYSICAL DUE IN 1 YEAR. Return in about 1 year (around 05/30/2019) for Physical.

## 2018-05-29 NOTE — Patient Instructions (Signed)

## 2018-05-30 LAB — COMPREHENSIVE METABOLIC PANEL
ALK PHOS: 66 IU/L (ref 39–117)
ALT: 12 IU/L (ref 0–32)
AST: 12 IU/L (ref 0–40)
Albumin/Globulin Ratio: 2 (ref 1.2–2.2)
Albumin: 4.6 g/dL (ref 3.5–5.5)
BILIRUBIN TOTAL: 0.2 mg/dL (ref 0.0–1.2)
BUN / CREAT RATIO: 10 (ref 9–23)
BUN: 8 mg/dL (ref 6–20)
CO2: 22 mmol/L (ref 20–29)
Calcium: 9.2 mg/dL (ref 8.7–10.2)
Chloride: 106 mmol/L (ref 96–106)
Creatinine, Ser: 0.83 mg/dL (ref 0.57–1.00)
GFR calc Af Amer: 104 mL/min/{1.73_m2} (ref >59)
GFR calc non Af Amer: 90 mL/min/{1.73_m2} (ref >59)
Globulin, Total: 2.3 g/dL (ref 1.5–4.5)
Glucose: 86 mg/dL (ref 65–99)
Potassium: 4.1 mmol/L (ref 3.5–5.2)
Sodium: 141 mmol/L (ref 134–144)
Total Protein: 6.9 g/dL (ref 6.0–8.5)

## 2018-05-30 LAB — CBC WITH DIFFERENTIAL/PLATELET
Basophils Absolute: 0.1 10*3/uL (ref 0.0–0.2)
Basos: 1 %
EOS (ABSOLUTE): 0.1 10*3/uL (ref 0.0–0.4)
Eos: 1 %
Hematocrit: 40.5 % (ref 34.0–46.6)
Hemoglobin: 14 g/dL (ref 11.1–15.9)
Immature Grans (Abs): 0.1 10*3/uL (ref 0.0–0.1)
Immature Granulocytes: 1 %
LYMPHS ABS: 2.9 10*3/uL (ref 0.7–3.1)
Lymphs: 32 %
MCH: 31 pg (ref 26.6–33.0)
MCHC: 34.6 g/dL (ref 31.5–35.7)
MCV: 90 fL (ref 79–97)
MONOCYTES: 6 %
Monocytes Absolute: 0.5 10*3/uL (ref 0.1–0.9)
Neutrophils Absolute: 5.3 10*3/uL (ref 1.4–7.0)
Neutrophils: 59 %
Platelets: 248 10*3/uL (ref 150–450)
RBC: 4.51 x10E6/uL (ref 3.77–5.28)
RDW: 11.9 % — ABNORMAL LOW (ref 12.3–15.4)
WBC: 8.9 10*3/uL (ref 3.4–10.8)

## 2018-05-30 LAB — LIPID PANEL W/O CHOL/HDL RATIO
CHOLESTEROL TOTAL: 148 mg/dL (ref 100–199)
HDL: 33 mg/dL — ABNORMAL LOW (ref >39)
LDL Calculated: 79 mg/dL (ref 0–99)
Triglycerides: 182 mg/dL — ABNORMAL HIGH (ref 0–149)
VLDL Cholesterol Cal: 36 mg/dL (ref 5–40)

## 2018-05-30 LAB — TSH: TSH: 3.19 u[IU]/mL (ref 0.450–4.500)

## 2018-06-28 LAB — UA/M W/RFLX CULTURE, ROUTINE
Bilirubin, UA: NEGATIVE
Glucose, UA: NEGATIVE
KETONES UA: NEGATIVE
Nitrite, UA: NEGATIVE
Specific Gravity, UA: 1.025 (ref 1.005–1.030)
Urobilinogen, Ur: 1 mg/dL (ref 0.2–1.0)
pH, UA: 7 (ref 5.0–7.5)

## 2018-06-28 LAB — MICROSCOPIC EXAMINATION

## 2018-06-28 LAB — URINE CULTURE, REFLEX

## 2018-07-23 ENCOUNTER — Ambulatory Visit (INDEPENDENT_AMBULATORY_CARE_PROVIDER_SITE_OTHER): Payer: 59

## 2018-07-23 DIAGNOSIS — Z3042 Encounter for surveillance of injectable contraceptive: Secondary | ICD-10-CM | POA: Diagnosis not present

## 2018-10-19 ENCOUNTER — Ambulatory Visit (INDEPENDENT_AMBULATORY_CARE_PROVIDER_SITE_OTHER): Payer: 59

## 2018-10-19 ENCOUNTER — Other Ambulatory Visit: Payer: Self-pay

## 2018-10-19 DIAGNOSIS — Z3042 Encounter for surveillance of injectable contraceptive: Secondary | ICD-10-CM | POA: Diagnosis not present

## 2019-01-11 ENCOUNTER — Ambulatory Visit (INDEPENDENT_AMBULATORY_CARE_PROVIDER_SITE_OTHER): Payer: 59

## 2019-01-11 ENCOUNTER — Other Ambulatory Visit: Payer: Self-pay

## 2019-01-11 DIAGNOSIS — Z3042 Encounter for surveillance of injectable contraceptive: Secondary | ICD-10-CM

## 2019-04-05 ENCOUNTER — Other Ambulatory Visit: Payer: Self-pay

## 2019-04-05 ENCOUNTER — Ambulatory Visit (INDEPENDENT_AMBULATORY_CARE_PROVIDER_SITE_OTHER): Payer: 59

## 2019-04-05 DIAGNOSIS — Z3042 Encounter for surveillance of injectable contraceptive: Secondary | ICD-10-CM | POA: Diagnosis not present

## 2019-05-31 ENCOUNTER — Encounter: Payer: 59 | Admitting: Family Medicine

## 2019-06-20 ENCOUNTER — Other Ambulatory Visit: Payer: Self-pay

## 2019-06-20 ENCOUNTER — Ambulatory Visit (INDEPENDENT_AMBULATORY_CARE_PROVIDER_SITE_OTHER): Payer: 59

## 2019-06-20 DIAGNOSIS — Z3042 Encounter for surveillance of injectable contraceptive: Secondary | ICD-10-CM

## 2019-06-20 MED ORDER — MEDROXYPROGESTERONE ACETATE 150 MG/ML IM SUSP
150.0000 mg | Freq: Once | INTRAMUSCULAR | Status: AC
  Administered 2019-06-20: 150 mg via INTRAMUSCULAR

## 2019-06-21 ENCOUNTER — Encounter: Payer: 59 | Admitting: Family Medicine

## 2019-07-22 ENCOUNTER — Encounter: Payer: Self-pay | Admitting: Family Medicine

## 2019-07-22 ENCOUNTER — Ambulatory Visit (INDEPENDENT_AMBULATORY_CARE_PROVIDER_SITE_OTHER): Payer: 59 | Admitting: Family Medicine

## 2019-07-22 ENCOUNTER — Other Ambulatory Visit: Payer: Self-pay

## 2019-07-22 VITALS — BP 111/76 | HR 81 | Temp 99.4°F | Ht 65.2 in | Wt 220.0 lb

## 2019-07-22 DIAGNOSIS — Z0000 Encounter for general adult medical examination without abnormal findings: Secondary | ICD-10-CM | POA: Diagnosis not present

## 2019-07-22 DIAGNOSIS — Z9109 Other allergy status, other than to drugs and biological substances: Secondary | ICD-10-CM | POA: Diagnosis not present

## 2019-07-22 DIAGNOSIS — R8281 Pyuria: Secondary | ICD-10-CM

## 2019-07-22 DIAGNOSIS — F4323 Adjustment disorder with mixed anxiety and depressed mood: Secondary | ICD-10-CM | POA: Diagnosis not present

## 2019-07-22 MED ORDER — SERTRALINE HCL 25 MG PO TABS: ORAL_TABLET | ORAL | 3 refills | Status: DC

## 2019-07-22 MED ORDER — ONDANSETRON 4 MG PO TBDP: 4.0000 mg | ORAL_TABLET | Freq: Three times a day (TID) | ORAL | 6 refills | Status: DC | PRN

## 2019-07-22 MED ORDER — TRIAMCINOLONE ACETONIDE 55 MCG/ACT NA AERO: 2.0000 | INHALATION_SPRAY | Freq: Every day | NASAL | 4 refills | Status: DC

## 2019-07-22 MED ORDER — PROBIOTIC-10 PO CHEW: 1.0000 | CHEWABLE_TABLET | Freq: Every day | ORAL | 11 refills | Status: DC

## 2019-07-22 MED ORDER — CRANBERRY 300 MG PO TABS: 300.0000 mg | ORAL_TABLET | Freq: Two times a day (BID) | ORAL | 11 refills | Status: DC

## 2019-07-22 MED ORDER — KETOTIFEN FUMARATE 0.025 % OP SOLN: OPHTHALMIC | 11 refills | Status: AC

## 2019-07-22 MED ORDER — MONTELUKAST SODIUM 10 MG PO TABS: ORAL_TABLET | ORAL | 3 refills | Status: DC

## 2019-07-22 MED ORDER — CETIRIZINE HCL 10 MG PO TABS: ORAL_TABLET | ORAL | 3 refills | Status: DC

## 2019-07-22 NOTE — Progress Notes (Signed)
BP 111/76   Pulse 81   Temp 99.4 F (37.4 C)   Ht 5' 5.2" (1.656 m)   Wt 220 lb (99.8 kg)   SpO2 100%   BMI 36.39 kg/m    Subjective:    Patient ID: Katrina Long, female    DOB: Apr 05, 1981, 39 y.o.   MRN: [id number]  HPI: Katrina Long is a 39 y.o. female presenting on 07/22/2019 for comprehensive medical examination. Current medical complaints include:  ANXIETY/STRESS Duration:exacerbated Anxious mood: yes  Excessive worrying: yes Irritability: no  Sweating: no Nausea: no Palpitations:no Hyperventilation: no Panic attacks: no Agoraphobia: no  Obscessions/compulsions: no Depressed mood: yes Depression screen [facility] 2/9 07/22/2019 05/29/2018 05/11/2017 11/10/2015  Decreased Interest 3 2 0 0  Down, Depressed, Hopeless 0 2 0 0  PHQ - 2 Score 3 4 0 0  Altered sleeping 0 1 - -  Tired, decreased energy 3 2 - -  Change in appetite 2 0 - -  Feeling bad or failure about yourself  2 1 - -  Trouble concentrating 0 0 - -  Moving slowly or fidgety/restless 0 0 - -  Suicidal thoughts 0 0 - -  PHQ-9 Score 10 8 - -  Difficult doing work/chores Very difficult Somewhat difficult - -   GAD 7 : Generalized Anxiety Score 05/29/2018  Nervous, Anxious, on Edge 1  Control/stop worrying 2  Worry too much - different things 2  Trouble relaxing 1  Restless 0  Easily annoyed or irritable 2  Afraid - awful might happen 0  Total GAD 7 Score 8  Anxiety Difficulty Not difficult at all   Anhedonia: no Weight changes: no Insomnia: no   Hypersomnia: yes Fatigue/loss of energy: yes Feelings of worthlessness: yes Feelings of guilt: yes Impaired concentration/indecisiveness: yes Suicidal ideations: no  Crying spells: no Recent Stressors/Life Changes: yes   Relationship problems: no   Family stress: yes     Financial stress: yes    Job stress: yes    Recent death/loss: no  Allergies have been doing well. Breathing has been doing well. No side effects. Feeling well. No other concerns.    Menopausal Symptoms: no  Depression Screen done today and results listed below:  Depression screen [facility] 2/9 07/22/2019 05/29/2018 05/11/2017 11/10/2015  Decreased Interest 3 2 0 0  Down, Depressed, Hopeless 0 2 0 0  PHQ - 2 Score 3 4 0 0  Altered sleeping 0 1 - -  Tired, decreased energy 3 2 - -  Change in appetite 2 0 - -  Feeling bad or failure about yourself  2 1 - -  Trouble concentrating 0 0 - -  Moving slowly or fidgety/restless 0 0 - -  Suicidal thoughts 0 0 - -  PHQ-9 Score 10 8 - -  Difficult doing work/chores Very difficult Somewhat difficult - -    Past Medical History:  Past Medical History:  Diagnosis Date  . Allergy   . Anemia   . History of abnormal cervical Pap smear   . Hypoglobulinemia   . Hypoproteinemia (HCC)     Surgical History:  History reviewed. No pertinent surgical history.  Medications:  Current Outpatient Medications on File Prior to Visit  Medication Sig  . AZO-CRANBERRY PO Take by mouth.   Current Facility-Administered Medications on File Prior to Visit  Medication  . medroxyPROGESTERone (DEPO-PROVERA) injection 150 mg    Allergies:  Allergies  Allergen Reactions  . Amoxicillin Rash  . Entex Lq [Phenylephrine-Guaifenesin] Rash  .  Penicillins Rash    Social History:  Social History   Socioeconomic History  . Marital status: Single    Spouse name: Not on file  . Number of children: Not on file  . Years of education: Not on file  . Highest education level: Not on file  Occupational History  . Not on file  Tobacco Use  . Smoking status: Never Smoker  . Smokeless tobacco: Never Used  Substance and Sexual Activity  . Alcohol use: No  . Drug use: No  . Sexual activity: Yes  Other Topics Concern  . Not on file  Social History Narrative  . Not on file   Social Determinants of Health   Financial Resource Strain:   . Difficulty of Paying Living Expenses: Not on file  Food Insecurity:   . Worried About Programme researcher, broadcasting/film/videounning Out of Food in  the Last Year: Not on file  . Ran Out of Food in the Last Year: Not on file  Transportation Needs:   . Lack of Transportation (Medical): Not on file  . Lack of Transportation (Non-Medical): Not on file  Physical Activity:   . Days of Exercise per Week: Not on file  . Minutes of Exercise per Session: Not on file  Stress:   . Feeling of Stress : Not on file  Social Connections:   . Frequency of Communication with Friends and Family: Not on file  . Frequency of Social Gatherings with Friends and Family: Not on file  . Attends Religious Services: Not on file  . Active Member of Clubs or Organizations: Not on file  . Attends BankerClub or Organization Meetings: Not on file  . Marital Status: Not on file  Intimate Partner Violence:   . Fear of Current or Ex-Partner: Not on file  . Emotionally Abused: Not on file  . Physically Abused: Not on file  . Sexually Abused: Not on file   Social History   Tobacco Use  Smoking Status Never Smoker  Smokeless Tobacco Never Used   Social History   Substance and Sexual Activity  Alcohol Use No    Family History:  Family History  Problem Relation Age of Onset  . Mental illness Mother   . Lung disease Mother   . Diabetes Sister   . Mental illness Brother   . Hypertension Maternal Grandmother   . Cancer Maternal Aunt 50       Breast    Past medical history, surgical history, medications, allergies, family history and social history reviewed with patient today and changes made to appropriate areas of the chart.   Review of Systems  Constitutional: Negative.   HENT: Negative.   Eyes: Negative.   Respiratory: Negative.   Cardiovascular: Negative.   Gastrointestinal: Positive for heartburn. Negative for abdominal pain, blood in stool, constipation, diarrhea, melena, nausea and vomiting.  Genitourinary: Negative.   Musculoskeletal: Negative.   Skin: Negative.   Neurological: Negative.   Endo/Heme/Allergies: Positive for environmental  allergies. Negative for polydipsia. Does not bruise/bleed easily.  Psychiatric/Behavioral: Positive for depression. Negative for hallucinations, memory loss, substance abuse and suicidal ideas. The patient is nervous/anxious. The patient does not have insomnia.     All other ROS negative except what is listed above and in the HPI.      Objective:    BP 111/76   Pulse 81   Temp 99.4 F (37.4 C)   Ht 5' 5.2" (1.656 m)   Wt 220 lb (99.8 kg)   SpO2 100%   BMI  36.39 kg/m   Wt Readings from Last 3 Encounters:  07/22/19 220 lb (99.8 kg)  05/29/18 222 lb (100.7 kg)  05/11/17 225 lb (102.1 kg)    Physical Exam Vitals and nursing note reviewed.  Constitutional:      General: She is not in acute distress.    Appearance: Normal appearance. She is not ill-appearing, toxic-appearing or diaphoretic.  HENT:     Head: Normocephalic and atraumatic.     Right Ear: Tympanic membrane, ear canal and external ear normal. There is no impacted cerumen.     Left Ear: Tympanic membrane, ear canal and external ear normal. There is no impacted cerumen.     Nose: Nose normal. No congestion or rhinorrhea.     Mouth/Throat:     Mouth: Mucous membranes are moist.     Pharynx: Oropharynx is clear. No oropharyngeal exudate or posterior oropharyngeal erythema.  Eyes:     General: No scleral icterus.       Right eye: No discharge.        Left eye: No discharge.     Extraocular Movements: Extraocular movements intact.     Conjunctiva/sclera: Conjunctivae normal.     Pupils: Pupils are equal, round, and reactive to light.  Neck:     Vascular: No carotid bruit.  Cardiovascular:     Rate and Rhythm: Normal rate and regular rhythm.     Pulses: Normal pulses.     Heart sounds: No murmur. No friction rub. No gallop.   Pulmonary:     Effort: Pulmonary effort is normal. No respiratory distress.     Breath sounds: Normal breath sounds. No stridor. No wheezing, rhonchi or rales.  Chest:     Chest wall: No  tenderness.  Abdominal:     General: Abdomen is flat. Bowel sounds are normal. There is no distension.     Palpations: Abdomen is soft. There is no mass.     Tenderness: There is no abdominal tenderness. There is no right CVA tenderness, left CVA tenderness, guarding or rebound.     Hernia: No hernia is present.  Genitourinary:    Comments: Breast and pelvic exams deferred with shared decision making Musculoskeletal:        General: No swelling, tenderness, deformity or signs of injury.     Cervical back: Normal range of motion and neck supple. No rigidity. No muscular tenderness.     Right lower leg: No edema.     Left lower leg: No edema.  Lymphadenopathy:     Cervical: No cervical adenopathy.  Skin:    General: Skin is warm and dry.     Capillary Refill: Capillary refill takes less than 2 seconds.     Coloration: Skin is not jaundiced or pale.     Findings: No bruising, erythema, lesion or rash.  Neurological:     General: No focal deficit present.     Mental Status: She is alert and oriented to person, place, and time. Mental status is at baseline.     Cranial Nerves: No cranial nerve deficit.     Sensory: No sensory deficit.     Motor: No weakness.     Coordination: Coordination normal.     Gait: Gait normal.     Deep Tendon Reflexes: Reflexes normal.  Psychiatric:        Mood and Affect: Mood normal.        Behavior: Behavior normal.        Thought Content: Thought content normal.  Judgment: Judgment normal.     Results for orders placed or performed in visit on 07/22/19  Microscopic Examination   URINE  Result Value Ref Range   WBC, UA 0-5 0 - 5 /hpf   RBC 0-2 0 - 2 /hpf   Epithelial Cells (non renal) 0-10 0 - 10 /hpf   Bacteria, UA None seen None seen/Few  Urine Culture, Reflex   URINE  Result Value Ref Range   Urine Culture, Routine WILL FOLLOW   CBC with Differential/Platelet  Result Value Ref Range   WBC 9.3 3.4 - 10.8 x10E3/uL   RBC 4.54 3.77 -  5.28 x10E6/uL   Hemoglobin 14.4 11.1 - 15.9 g/dL   Hematocrit 22.2 97.9 - 46.6 %   MCV 91 79 - 97 fL   MCH 31.7 26.6 - 33.0 pg   MCHC 34.8 31.5 - 35.7 g/dL   RDW 89.2 11.9 - 41.7 %   Platelets 243 150 - 450 x10E3/uL   Neutrophils 60 Not Estab. %   Lymphs 31 Not Estab. %   Monocytes 6 Not Estab. %   Eos 1 Not Estab. %   Basos 1 Not Estab. %   Neutrophils Absolute 5.7 1.4 - 7.0 x10E3/uL   Lymphocytes Absolute 2.9 0.7 - 3.1 x10E3/uL   Monocytes Absolute 0.5 0.1 - 0.9 x10E3/uL   EOS (ABSOLUTE) 0.1 0.0 - 0.4 x10E3/uL   Basophils Absolute 0.1 0.0 - 0.2 x10E3/uL   Immature Granulocytes 1 Not Estab. %   Immature Grans (Abs) 0.1 0.0 - 0.1 x10E3/uL  Comprehensive metabolic panel  Result Value Ref Range   Glucose 86 65 - 99 mg/dL   BUN 7 6 - 20 mg/dL   Creatinine, Ser 4.08 0.57 - 1.00 mg/dL   GFR calc non Af Amer 104 >59 mL/min/1.73   GFR calc Af Amer 120 >59 mL/min/1.73   BUN/Creatinine Ratio 10 9 - 23   Sodium 138 134 - 144 mmol/L   Potassium 4.1 3.5 - 5.2 mmol/L   Chloride 104 96 - 106 mmol/L   CO2 20 20 - 29 mmol/L   Calcium 9.6 8.7 - 10.2 mg/dL   Total Protein 7.2 6.0 - 8.5 g/dL   Albumin 4.6 3.8 - 4.8 g/dL   Globulin, Total 2.6 1.5 - 4.5 g/dL   Albumin/Globulin Ratio 1.8 1.2 - 2.2   Bilirubin Total 0.4 0.0 - 1.2 mg/dL   Alkaline Phosphatase 69 39 - 117 IU/L   AST 21 0 - 40 IU/L   ALT 25 0 - 32 IU/L  Lipid Panel w/o Chol/HDL Ratio  Result Value Ref Range   Cholesterol, Total 159 100 - 199 mg/dL   Triglycerides 144 (H) 0 - 149 mg/dL   HDL 36 (L) >81 mg/dL   VLDL Cholesterol Cal 37 5 - 40 mg/dL   LDL Chol Calc (NIH) 86 0 - 99 mg/dL  Microalbumin, Urine Waived  Result Value Ref Range   Microalb, Ur Waived 30 (H) 0 - 19 mg/L   Creatinine, Urine Waived 200 10 - 300 mg/dL   Microalb/Creat Ratio <30 <30 mg/g  TSH  Result Value Ref Range   TSH 2.660 0.450 - 4.500 uIU/mL  UA/M w/rflx Culture, Routine   Specimen: Urine   URINE  Result Value Ref Range   Specific Gravity, UA  1.020 1.005 - 1.030   pH, UA 6.5 5.0 - 7.5   Color, UA Yellow Yellow   Appearance Ur Clear Clear   Leukocytes,UA 1+ (A) Negative   Protein,UA Negative Negative/Trace  Glucose, UA Negative Negative   Ketones, UA Negative Negative   RBC, UA Trace (A) Negative   Bilirubin, UA Negative Negative   Urobilinogen, Ur 0.2 0.2 - 1.0 mg/dL   Nitrite, UA Negative Negative   Microscopic Examination See below:    Urinalysis Reflex Comment   VITAMIN D 25 Hydroxy (Vit-D Deficiency, Fractures)  Result Value Ref Range   Vit D, 25-Hydroxy 10.5 (L) 30.0 - 100.0 ng/mL      Assessment & Plan:   Problem List Items Addressed This Visit      Other   Environmental allergies    Under good control on current regimen. Continue current regimen. Continue to monitor. Call with any concerns. Refills given. Call with any concerns.        Adjustment disorder with mixed anxiety and depressed mood    Daughter recently diagnosed with terminal cancer. She's hanging in there, but very reasonably having trouble. Would like to start some meds to see if we can help with the concentration. Will start low dose zoloft and recheck 1 month. Call with any concerns.        Other Visit Diagnoses    Routine general medical examination at a health care facility    -  Primary   Vaccines up to date. Pap up to date. Continue diet and exercise. Call with any concerns. Continue to monitor.    Relevant Orders   CBC with Differential/Platelet (Completed)   Comprehensive metabolic panel (Completed)   Lipid Panel w/o Chol/HDL Ratio (Completed)   Microalbumin, Urine Waived (Completed)   TSH (Completed)   UA/M w/rflx Culture, Routine (Completed)   VITAMIN D 25 Hydroxy (Vit-D Deficiency, Fractures) (Completed)   Pyuria       Relevant Orders   Microscopic Examination (Completed)   Urine Culture, Reflex (Completed)       Follow up plan: Return in about 4 weeks (around 08/19/2019) for follow up mood.   LABORATORY TESTING:  -  Pap smear: up to date  IMMUNIZATIONS:   - Tdap: Tetanus vaccination status reviewed: last tetanus booster within 10 years. - Influenza: Up to date - Pneumovax: Up to date  PATIENT COUNSELING:   Advised to take 1 mg of folate supplement per day if capable of pregnancy.   Sexuality: Discussed sexually transmitted diseases, partner selection, use of condoms, avoidance of unintended pregnancy  and contraceptive alternatives.   Advised to avoid cigarette smoking.  I discussed with the patient that most people either abstain from alcohol or drink within safe limits (<=14/week and <=4 drinks/occasion for males, <=7/weeks and <= 3 drinks/occasion for females) and that the risk for alcohol disorders and other health effects rises proportionally with the number of drinks per week and how often a drinker exceeds daily limits.  Discussed cessation/primary prevention of drug use and availability of treatment for abuse.   Diet: Encouraged to adjust caloric intake to maintain  or achieve ideal body weight, to reduce intake of dietary saturated fat and total fat, to limit sodium intake by avoiding high sodium foods and not adding table salt, and to maintain adequate dietary potassium and calcium preferably from fresh fruits, vegetables, and low-fat dairy products.    stressed the importance of regular exercise  Injury prevention: Discussed safety belts, safety helmets, smoke detector, smoking near bedding or upholstery.   Dental health: Discussed importance of regular tooth brushing, flossing, and dental visits.    NEXT PREVENTATIVE PHYSICAL DUE IN 1 YEAR. Return in about 4 weeks (around 08/19/2019) for follow  up mood.

## 2019-07-22 NOTE — Patient Instructions (Signed)
Health Maintenance, Female Adopting a healthy lifestyle and getting preventive care are important in promoting health and wellness. Ask your health care provider about:  The right schedule for you to have regular tests and exams.  Things you can do on your own to prevent diseases and keep yourself healthy. What should I know about diet, weight, and exercise? Eat a healthy diet   Eat a diet that includes plenty of vegetables, fruits, low-fat dairy products, and lean protein.  Do not eat a lot of foods that are high in solid fats, added sugars, or sodium. Maintain a healthy weight Body mass index (BMI) is used to identify weight problems. It estimates body fat based on height and weight. Your health care provider can help determine your BMI and help you achieve or maintain a healthy weight. Get regular exercise Get regular exercise. This is one of the most important things you can do for your health. Most adults should:  Exercise for at least 150 minutes each week. The exercise should increase your heart rate and make you sweat (moderate-intensity exercise).  Do strengthening exercises at least twice a week. This is in addition to the moderate-intensity exercise.  Spend less time sitting. Even light physical activity can be beneficial. Watch cholesterol and blood lipids Have your blood tested for lipids and cholesterol at 39 years of age, then have this test every 5 years. Have your cholesterol levels checked more often if:  Your lipid or cholesterol levels are high.  You are older than 40 years of age.  You are at high risk for heart disease. What should I know about cancer screening? Depending on your health history and family history, you may need to have cancer screening at various ages. This may include screening for:  Breast cancer.  Cervical cancer.  Colorectal cancer.  Skin cancer.  Lung cancer. What should I know about heart disease, diabetes, and high blood  pressure? Blood pressure and heart disease  High blood pressure causes heart disease and increases the risk of stroke. This is more likely to develop in people who have high blood pressure readings, are of African descent, or are overweight.  Have your blood pressure checked: ? Every 3-5 years if you are 18-39 years of age. ? Every year if you are 40 years old or older. Diabetes Have regular diabetes screenings. This checks your fasting blood sugar level. Have the screening done:  Once every three years after age 40 if you are at a normal weight and have a low risk for diabetes.  More often and at a younger age if you are overweight or have a high risk for diabetes. What should I know about preventing infection? Hepatitis B If you have a higher risk for hepatitis B, you should be screened for this virus. Talk with your health care provider to find out if you are at risk for hepatitis B infection. Hepatitis C Testing is recommended for:  Everyone born from 1945 through 1965.  Anyone with known risk factors for hepatitis C. Sexually transmitted infections (STIs)  Get screened for STIs, including gonorrhea and chlamydia, if: ? You are sexually active and are younger than 39 years of age. ? You are older than 39 years of age and your health care provider tells you that you are at risk for this type of infection. ? Your sexual activity has changed since you were last screened, and you are at increased risk for chlamydia or gonorrhea. Ask your health care provider if   you are at risk.  Ask your health care provider about whether you are at high risk for HIV. Your health care provider may recommend a prescription medicine to help prevent HIV infection. If you choose to take medicine to prevent HIV, you should first get tested for HIV. You should then be tested every 3 months for as long as you are taking the medicine. Pregnancy  If you are about to stop having your period (premenopausal) and  you may become pregnant, seek counseling before you get pregnant.  Take 400 to 800 micrograms (mcg) of folic acid every day if you become pregnant.  Ask for birth control (contraception) if you want to prevent pregnancy. Osteoporosis and menopause Osteoporosis is a disease in which the bones lose minerals and strength with aging. This can result in bone fractures. If you are 65 years old or older, or if you are at risk for osteoporosis and fractures, ask your health care provider if you should:  Be screened for bone loss.  Take a calcium or vitamin D supplement to lower your risk of fractures.  Be given hormone replacement therapy (HRT) to treat symptoms of menopause. Follow these instructions at home: Lifestyle  Do not use any products that contain nicotine or tobacco, such as cigarettes, e-cigarettes, and chewing tobacco. If you need help quitting, ask your health care provider.  Do not use street drugs.  Do not share needles.  Ask your health care provider for help if you need support or information about quitting drugs. Alcohol use  Do not drink alcohol if: ? Your health care provider tells you not to drink. ? You are pregnant, may be pregnant, or are planning to become pregnant.  If you drink alcohol: ? Limit how much you use to 0-1 drink a day. ? Limit intake if you are breastfeeding.  Be aware of how much alcohol is in your drink. In the [place] one drink equals one 12 oz bottle of beer (355 mL), one 5 oz glass of wine (148 mL), or one 1 oz glass of hard liquor (44 mL). General instructions  Schedule regular health, dental, and eye exams.  Stay current with your vaccines.  Tell your health care provider if: ? You often feel depressed. ? You have ever been abused or do not feel safe at home. Summary  Adopting a healthy lifestyle and getting preventive care are important in promoting health and wellness.  Follow your health care provider's instructions about healthy  diet, exercising, and getting tested or screened for diseases.  Follow your health care provider's instructions on monitoring your cholesterol and blood pressure. This information is not intended to replace advice given to you by your health care provider. Make sure you discuss any questions you have with your health care provider. Document Revised: 05/23/2018 Document Reviewed: 05/23/2018 Elsevier Patient Education  2020 [employer]  

## 2019-07-23 ENCOUNTER — Other Ambulatory Visit: Payer: Self-pay | Admitting: Family Medicine

## 2019-07-23 DIAGNOSIS — F4323 Adjustment disorder with mixed anxiety and depressed mood: Secondary | ICD-10-CM | POA: Insufficient documentation

## 2019-07-23 LAB — COMPREHENSIVE METABOLIC PANEL
ALT: 25 IU/L (ref 0–32)
AST: 21 IU/L (ref 0–40)
Albumin/Globulin Ratio: 1.8 (ref 1.2–2.2)
Albumin: 4.6 g/dL (ref 3.8–4.8)
Alkaline Phosphatase: 69 IU/L (ref 39–117)
BUN/Creatinine Ratio: 10 (ref 9–23)
BUN: 7 mg/dL (ref 6–20)
Bilirubin Total: 0.4 mg/dL (ref 0.0–1.2)
CO2: 20 mmol/L (ref 20–29)
Calcium: 9.6 mg/dL (ref 8.7–10.2)
Chloride: 104 mmol/L (ref 96–106)
Creatinine, Ser: 0.73 mg/dL (ref 0.57–1.00)
GFR calc Af Amer: 120 mL/min/{1.73_m2} (ref >59)
GFR calc non Af Amer: 104 mL/min/{1.73_m2} (ref >59)
Globulin, Total: 2.6 g/dL (ref 1.5–4.5)
Glucose: 86 mg/dL (ref 65–99)
Potassium: 4.1 mmol/L (ref 3.5–5.2)
Sodium: 138 mmol/L (ref 134–144)
Total Protein: 7.2 g/dL (ref 6.0–8.5)

## 2019-07-23 LAB — CBC WITH DIFFERENTIAL/PLATELET
Basophils Absolute: 0.1 10*3/uL (ref 0.0–0.2)
Basos: 1 %
EOS (ABSOLUTE): 0.1 10*3/uL (ref 0.0–0.4)
Eos: 1 %
Hematocrit: 41.4 % (ref 34.0–46.6)
Hemoglobin: 14.4 g/dL (ref 11.1–15.9)
Immature Grans (Abs): 0.1 10*3/uL (ref 0.0–0.1)
Immature Granulocytes: 1 %
Lymphocytes Absolute: 2.9 10*3/uL (ref 0.7–3.1)
Lymphs: 31 %
MCH: 31.7 pg (ref 26.6–33.0)
MCHC: 34.8 g/dL (ref 31.5–35.7)
MCV: 91 fL (ref 79–97)
Monocytes Absolute: 0.5 10*3/uL (ref 0.1–0.9)
Monocytes: 6 %
Neutrophils Absolute: 5.7 10*3/uL (ref 1.4–7.0)
Neutrophils: 60 %
Platelets: 243 10*3/uL (ref 150–450)
RBC: 4.54 x10E6/uL (ref 3.77–5.28)
RDW: 11.7 % (ref 11.7–15.4)
WBC: 9.3 10*3/uL (ref 3.4–10.8)

## 2019-07-23 LAB — TSH: TSH: 2.66 u[IU]/mL (ref 0.450–4.500)

## 2019-07-23 LAB — LIPID PANEL W/O CHOL/HDL RATIO
Cholesterol, Total: 159 mg/dL (ref 100–199)
HDL: 36 mg/dL — ABNORMAL LOW (ref >39)
LDL Chol Calc (NIH): 86 mg/dL (ref 0–99)
Triglycerides: 220 mg/dL — ABNORMAL HIGH (ref 0–149)
VLDL Cholesterol Cal: 37 mg/dL (ref 5–40)

## 2019-07-23 LAB — VITAMIN D 25 HYDROXY (VIT D DEFICIENCY, FRACTURES): Vit D, 25-Hydroxy: 10.5 ng/mL — ABNORMAL LOW (ref 30.0–100.0)

## 2019-07-23 MED ORDER — VITAMIN D (ERGOCALCIFEROL) 1.25 MG (50000 UNIT) PO CAPS: 50000.0000 [IU] | ORAL_CAPSULE | ORAL | 0 refills | Status: DC

## 2019-07-23 NOTE — Assessment & Plan Note (Signed)
Daughter recently diagnosed with terminal cancer. She's hanging in there, but very reasonably having trouble. Would like to start some meds to see if we can help with the concentration. Will start low dose zoloft and recheck 1 month. Call with any concerns.

## 2019-07-23 NOTE — Assessment & Plan Note (Signed)
Under good control on current regimen. Continue current regimen. Continue to monitor. Call with any concerns. Refills given. Call with any concerns.  

## 2019-07-26 LAB — UA/M W/RFLX CULTURE, ROUTINE
Bilirubin, UA: NEGATIVE
Glucose, UA: NEGATIVE
Ketones, UA: NEGATIVE
Nitrite, UA: NEGATIVE
Protein,UA: NEGATIVE
Specific Gravity, UA: 1.02 (ref 1.005–1.030)
Urobilinogen, Ur: 0.2 mg/dL (ref 0.2–1.0)
pH, UA: 6.5 (ref 5.0–7.5)

## 2019-07-26 LAB — MICROSCOPIC EXAMINATION: Bacteria, UA: NONE SEEN

## 2019-07-26 LAB — URINE CULTURE, REFLEX

## 2019-07-26 LAB — MICROALBUMIN, URINE WAIVED
Creatinine, Urine Waived: 200 mg/dL (ref 10–300)
Microalb, Ur Waived: 30 mg/L — ABNORMAL HIGH (ref 0–19)
Microalb/Creat Ratio: <30 mg/g (ref <30)

## 2019-08-19 ENCOUNTER — Telehealth (INDEPENDENT_AMBULATORY_CARE_PROVIDER_SITE_OTHER): Payer: 59 | Admitting: Family Medicine

## 2019-08-19 ENCOUNTER — Encounter: Payer: Self-pay | Admitting: Family Medicine

## 2019-08-19 DIAGNOSIS — F4323 Adjustment disorder with mixed anxiety and depressed mood: Secondary | ICD-10-CM | POA: Diagnosis not present

## 2019-08-19 NOTE — Assessment & Plan Note (Signed)
In exacerbation as her daughter is now on hospice. Tolerating medicine well, but unsure if it's helping. Will continue current dose and recheck 1 month. Call with any concerns.

## 2019-08-19 NOTE — Progress Notes (Signed)
There were no vitals taken for this visit.   Subjective:    Patient ID: Katrina Long, female    DOB: 1980/10/16, 39 y.o.   MRN: [id number]  HPI: Katrina Long is a 39 y.o. female  Chief Complaint  Patient presents with  . Anxiety   DEPRESSION- didn't take the medicine for about a week and a half as her daughter was back in the hospital. She came home on hospice.  Mood status: exacerbated Satisfied with current treatment?: yes Symptom severity: moderate  Duration of current treatment : 1 month Side effects: no Medication compliance: excellent compliance Psychotherapy/counseling: no  Previous psychiatric medications: sertraline Depressed mood: yes Anxious mood: yes Anhedonia: no Significant weight loss or gain: no Insomnia: no  Fatigue: yes Feelings of worthlessness or guilt: yes Impaired concentration/indecisiveness: no Suicidal ideations: no Hopelessness: no Crying spells: no Depression screen [facility] 2/9 08/19/2019 07/22/2019 05/29/2018 05/11/2017 11/10/2015  Decreased Interest 2 3 2  0 0  Down, Depressed, Hopeless 0 0 2 0 0  PHQ - 2 Score 2 3 4  0 0  Altered sleeping 0 0 1 - -  Tired, decreased energy 3 3 2  - -  Change in appetite 0 2 0 - -  Feeling bad or failure about yourself  0 2 1 - -  Trouble concentrating 3 0 0 - -  Moving slowly or fidgety/restless 1 0 0 - -  Suicidal thoughts 0 0 0 - -  PHQ-9 Score 9 10 8  - -  Difficult doing work/chores Somewhat difficult Very difficult Somewhat difficult - -    Relevant past medical, surgical, family and social history reviewed and updated as indicated. Interim medical history since our last visit reviewed. Allergies and medications reviewed and updated.  Review of Systems  Constitutional: Negative.   Respiratory: Negative.   Cardiovascular: Negative.   Musculoskeletal: Negative.   Neurological: Negative.   Psychiatric/Behavioral: Negative.     Per HPI unless specifically indicated above     Objective:    There were no  vitals taken for this visit.  Wt Readings from Last 3 Encounters:  07/22/19 220 lb (99.8 kg)  05/29/18 222 lb (100.7 kg)  05/11/17 225 lb (102.1 kg)    Physical Exam Vitals and nursing note reviewed.  Constitutional:      General: She is not in acute distress.    Appearance: Normal appearance. She is not ill-appearing, toxic-appearing or diaphoretic.  HENT:     Head: Normocephalic and atraumatic.     Right Ear: External ear normal.     Left Ear: External ear normal.     Nose: Nose normal.     Mouth/Throat:     Mouth: Mucous membranes are moist.     Pharynx: Oropharynx is clear.  Eyes:     General: No scleral icterus.       Right eye: No discharge.        Left eye: No discharge.     Conjunctiva/sclera: Conjunctivae normal.     Pupils: Pupils are equal, round, and reactive to light.  Pulmonary:     Effort: Pulmonary effort is normal. No respiratory distress.     Comments: Speaking in full sentences Musculoskeletal:        General: Normal range of motion.     Cervical back: Normal range of motion.  Skin:    Coloration: Skin is not jaundiced or pale.     Findings: No bruising, erythema, lesion or rash.  Neurological:     Mental  Status: She is alert and oriented to person, place, and time. Mental status is at baseline.  Psychiatric:        Mood and Affect: Mood normal.        Behavior: Behavior normal.        Thought Content: Thought content normal.        Judgment: Judgment normal.     Results for orders placed or performed in visit on 07/22/19  Microscopic Examination   URINE  Result Value Ref Range   WBC, UA 0-5 0 - 5 /hpf   RBC 0-2 0 - 2 /hpf   Epithelial Cells (non renal) 0-10 0 - 10 /hpf   Bacteria, UA None seen None seen/Few  Urine Culture, Reflex   URINE  Result Value Ref Range   Urine Culture, Routine Final report    Organism ID, Bacteria Comment   CBC with Differential/Platelet  Result Value Ref Range   WBC 9.3 3.4 - 10.8 x10E3/uL   RBC 4.54 3.77 -  5.28 x10E6/uL   Hemoglobin 14.4 11.1 - 15.9 g/dL   Hematocrit 29.9 24.2 - 46.6 %   MCV 91 79 - 97 fL   MCH 31.7 26.6 - 33.0 pg   MCHC 34.8 31.5 - 35.7 g/dL   RDW 68.3 41.9 - 62.2 %   Platelets 243 150 - 450 x10E3/uL   Neutrophils 60 Not Estab. %   Lymphs 31 Not Estab. %   Monocytes 6 Not Estab. %   Eos 1 Not Estab. %   Basos 1 Not Estab. %   Neutrophils Absolute 5.7 1.4 - 7.0 x10E3/uL   Lymphocytes Absolute 2.9 0.7 - 3.1 x10E3/uL   Monocytes Absolute 0.5 0.1 - 0.9 x10E3/uL   EOS (ABSOLUTE) 0.1 0.0 - 0.4 x10E3/uL   Basophils Absolute 0.1 0.0 - 0.2 x10E3/uL   Immature Granulocytes 1 Not Estab. %   Immature Grans (Abs) 0.1 0.0 - 0.1 x10E3/uL  Comprehensive metabolic panel  Result Value Ref Range   Glucose 86 65 - 99 mg/dL   BUN 7 6 - 20 mg/dL   Creatinine, Ser 2.97 0.57 - 1.00 mg/dL   GFR calc non Af Amer 104 >59 mL/min/1.73   GFR calc Af Amer 120 >59 mL/min/1.73   BUN/Creatinine Ratio 10 9 - 23   Sodium 138 134 - 144 mmol/L   Potassium 4.1 3.5 - 5.2 mmol/L   Chloride 104 96 - 106 mmol/L   CO2 20 20 - 29 mmol/L   Calcium 9.6 8.7 - 10.2 mg/dL   Total Protein 7.2 6.0 - 8.5 g/dL   Albumin 4.6 3.8 - 4.8 g/dL   Globulin, Total 2.6 1.5 - 4.5 g/dL   Albumin/Globulin Ratio 1.8 1.2 - 2.2   Bilirubin Total 0.4 0.0 - 1.2 mg/dL   Alkaline Phosphatase 69 39 - 117 IU/L   AST 21 0 - 40 IU/L   ALT 25 0 - 32 IU/L  Lipid Panel w/o Chol/HDL Ratio  Result Value Ref Range   Cholesterol, Total 159 100 - 199 mg/dL   Triglycerides 989 (H) 0 - 149 mg/dL   HDL 36 (L) >21 mg/dL   VLDL Cholesterol Cal 37 5 - 40 mg/dL   LDL Chol Calc (NIH) 86 0 - 99 mg/dL  Microalbumin, Urine Waived  Result Value Ref Range   Microalb, Ur Waived 30 (H) 0 - 19 mg/L   Creatinine, Urine Waived 200 10 - 300 mg/dL   Microalb/Creat Ratio <30 <30 mg/g  TSH  Result Value Ref Range  TSH 2.660 0.450 - 4.500 uIU/mL  UA/M w/rflx Culture, Routine   Specimen: Urine   URINE  Result Value Ref Range   Specific Gravity, UA  1.020 1.005 - 1.030   pH, UA 6.5 5.0 - 7.5   Color, UA Yellow Yellow   Appearance Ur Clear Clear   Leukocytes,UA 1+ (A) Negative   Protein,UA Negative Negative/Trace   Glucose, UA Negative Negative   Ketones, UA Negative Negative   RBC, UA Trace (A) Negative   Bilirubin, UA Negative Negative   Urobilinogen, Ur 0.2 0.2 - 1.0 mg/dL   Nitrite, UA Negative Negative   Microscopic Examination See below:    Urinalysis Reflex Comment   VITAMIN D 25 Hydroxy (Vit-D Deficiency, Fractures)  Result Value Ref Range   Vit D, 25-Hydroxy 10.5 (L) 30.0 - 100.0 ng/mL      Assessment & Plan:   Problem List Items Addressed This Visit      Other   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 Tolerating medicine well, but unsure if it's helping. Will continue current dose and recheck 1 month. Call with any concerns.           Follow up plan: Return in about 4 weeks (around 09/16/2019).    . This visit was completed via telephone due to the restrictions of the COVID-19 pandemic. All issues as above were discussed and addressed but no physical exam was performed. If it was felt that the patient should be evaluated in the office, they were directed there. The patient verbally consented to this visit. Patient was unable to complete an audio/visual visit due to Technical difficulties. Due to the catastrophic nature of the COVID-19 pandemic, this visit was done through audio contact only. . Location of the patient: home . Location of the provider: work . Those involved with this call:  . Provider: Park Liter, DO . CMA: Tiffany Reel, CMA . Front Desk/Registration: Don Perking  . Time spent on call: 15 minutes on the phone discussing health concerns. 23 minutes total spent in review of patient's record and preparation of their chart.

## 2019-09-06 ENCOUNTER — Ambulatory Visit (INDEPENDENT_AMBULATORY_CARE_PROVIDER_SITE_OTHER): Payer: 59

## 2019-09-06 ENCOUNTER — Other Ambulatory Visit: Payer: Self-pay

## 2019-09-06 DIAGNOSIS — Z308 Encounter for other contraceptive management: Secondary | ICD-10-CM | POA: Diagnosis not present

## 2019-09-06 DIAGNOSIS — Z3042 Encounter for surveillance of injectable contraceptive: Secondary | ICD-10-CM | POA: Diagnosis not present

## 2019-11-29 ENCOUNTER — Other Ambulatory Visit: Payer: Self-pay

## 2019-11-29 ENCOUNTER — Ambulatory Visit (INDEPENDENT_AMBULATORY_CARE_PROVIDER_SITE_OTHER): Payer: No Typology Code available for payment source

## 2019-11-29 DIAGNOSIS — Z308 Encounter for other contraceptive management: Secondary | ICD-10-CM

## 2019-11-29 DIAGNOSIS — Z3042 Encounter for surveillance of injectable contraceptive: Secondary | ICD-10-CM

## 2020-02-28 ENCOUNTER — Other Ambulatory Visit: Payer: Self-pay

## 2020-02-28 ENCOUNTER — Ambulatory Visit (INDEPENDENT_AMBULATORY_CARE_PROVIDER_SITE_OTHER): Payer: No Typology Code available for payment source

## 2020-02-28 DIAGNOSIS — Z308 Encounter for other contraceptive management: Secondary | ICD-10-CM | POA: Diagnosis not present

## 2020-02-28 DIAGNOSIS — Z3042 Encounter for surveillance of injectable contraceptive: Secondary | ICD-10-CM | POA: Diagnosis not present

## 2020-05-22 ENCOUNTER — Other Ambulatory Visit: Payer: Self-pay

## 2020-05-22 ENCOUNTER — Ambulatory Visit (INDEPENDENT_AMBULATORY_CARE_PROVIDER_SITE_OTHER): Payer: No Typology Code available for payment source

## 2020-05-22 DIAGNOSIS — Z3042 Encounter for surveillance of injectable contraceptive: Secondary | ICD-10-CM

## 2020-05-22 DIAGNOSIS — Z308 Encounter for other contraceptive management: Secondary | ICD-10-CM | POA: Diagnosis not present

## 2020-08-07 ENCOUNTER — Other Ambulatory Visit: Payer: Self-pay

## 2020-08-07 ENCOUNTER — Ambulatory Visit (INDEPENDENT_AMBULATORY_CARE_PROVIDER_SITE_OTHER): Payer: Managed Care, Other (non HMO) | Admitting: Family Medicine

## 2020-08-07 ENCOUNTER — Other Ambulatory Visit (HOSPITAL_COMMUNITY)
Admission: RE | Admit: 2020-08-07 | Discharge: 2020-08-07 | Disposition: A | Discharge status: Home / Self Care | Payer: BLUE CROSS/BLUE SHIELD | Source: Ambulatory Visit | Attending: Family Medicine | Admitting: Family Medicine

## 2020-08-07 ENCOUNTER — Encounter: Payer: Self-pay | Admitting: Family Medicine

## 2020-08-07 VITALS — BP 110/73 | HR 80 | Temp 98.0°F | Ht 65.5 in | Wt 235.6 lb

## 2020-08-07 DIAGNOSIS — Z3042 Encounter for surveillance of injectable contraceptive: Secondary | ICD-10-CM

## 2020-08-07 DIAGNOSIS — Z124 Encounter for screening for malignant neoplasm of cervix: Secondary | ICD-10-CM | POA: Diagnosis not present

## 2020-08-07 DIAGNOSIS — Z308 Encounter for other contraceptive management: Secondary | ICD-10-CM | POA: Diagnosis not present

## 2020-08-07 DIAGNOSIS — Z1231 Encounter for screening mammogram for malignant neoplasm of breast: Secondary | ICD-10-CM | POA: Diagnosis not present

## 2020-08-07 DIAGNOSIS — Z0000 Encounter for general adult medical examination without abnormal findings: Secondary | ICD-10-CM

## 2020-08-07 LAB — URINALYSIS, ROUTINE W REFLEX MICROSCOPIC
Bilirubin, UA: NEGATIVE
Glucose, UA: NEGATIVE
Nitrite, UA: NEGATIVE
Specific Gravity, UA: 1.02 (ref 1.005–1.030)
Urobilinogen, Ur: 1 mg/dL (ref 0.2–1.0)
pH, UA: 7 (ref 5.0–7.5)

## 2020-08-07 LAB — MICROSCOPIC EXAMINATION
Crystal Type: NONE SEEN
Crystals: NONE SEEN
Epithelial Cells (non renal): >10 /hpf — AB (ref 0–10)
Mucus, UA: NONE SEEN
Renal Epithel, UA: NONE SEEN /hpf
Trichomonas, UA: NONE SEEN
WBC, UA: >30 /hpf — AB (ref 0–5)
Yeast, UA: NONE SEEN

## 2020-08-07 MED ORDER — PROBIOTIC-10 PO CHEW: 1.0000 | CHEWABLE_TABLET | Freq: Every day | ORAL | 11 refills | Status: DC

## 2020-08-07 MED ORDER — CETIRIZINE HCL 10 MG PO TABS: ORAL_TABLET | ORAL | 3 refills | Status: DC

## 2020-08-07 MED ORDER — ONDANSETRON 4 MG PO TBDP: 4.0000 mg | ORAL_TABLET | Freq: Three times a day (TID) | ORAL | 6 refills | Status: DC | PRN

## 2020-08-07 MED ORDER — MEDROXYPROGESTERONE ACETATE 150 MG/ML IM SUSP: 150.0000 mg | INTRAMUSCULAR | Status: AC

## 2020-08-07 MED ORDER — MONTELUKAST SODIUM 10 MG PO TABS: ORAL_TABLET | ORAL | 3 refills | Status: DC

## 2020-08-07 NOTE — Patient Instructions (Addendum)
Call to schedule your mammogram: [facility]  Address: [address], [place], [place] [address]  Phone: [phone]   Health Maintenance, Female Adopting a healthy lifestyle and getting preventive care are important in promoting health and wellness. Ask your health care provider about:  The right schedule for you to have regular tests and exams.  Things you can do on your own to prevent diseases and keep yourself healthy. What should I know about diet, weight, and exercise? Eat a healthy diet  Eat a diet that includes plenty of vegetables, fruits, low-fat dairy products, and lean protein.  Do not eat a lot of foods that are high in solid fats, added sugars, or sodium.   Maintain a healthy weight Body mass index (BMI) is used to identify weight problems. It estimates body fat based on height and weight. Your health care provider can help determine your BMI and help you achieve or maintain a healthy weight. Get regular exercise Get regular exercise. This is one of the most important things you can do for your health. Most adults should:  Exercise for at least 150 minutes each week. The exercise should increase your heart rate and make you sweat (moderate-intensity exercise).  Do strengthening exercises at least twice a week. This is in addition to the moderate-intensity exercise.  Spend less time sitting. Even light physical activity can be beneficial. Watch cholesterol and blood lipids Have your blood tested for lipids and cholesterol at 40 years of age, then have this test every 5 years. Have your cholesterol levels checked more often if:  Your lipid or cholesterol levels are high.  You are older than 40 years of age.  You are at high risk for heart disease. What should I know about cancer screening? Depending on your health history and family history, you may need to have cancer screening at various ages. This may include screening  for:  Breast cancer.  Cervical cancer.  Colorectal cancer.  Skin cancer.  Lung cancer. What should I know about heart disease, diabetes, and high blood pressure? Blood pressure and heart disease  High blood pressure causes heart disease and increases the risk of stroke. This is more likely to develop in people who have high blood pressure readings, are of African descent, or are overweight.  Have your blood pressure checked: ? Every 3-5 years if you are 18-39 years of age. ? Every year if you are 40 years old or older. Diabetes Have regular diabetes screenings. This checks your fasting blood sugar level. Have the screening done:  Once every three years after age 40 if you are at a normal weight and have a low risk for diabetes.  More often and at a younger age if you are overweight or have a high risk for diabetes. What should I know about preventing infection? Hepatitis B If you have a higher risk for hepatitis B, you should be screened for this virus. Talk with your health care provider to find out if you are at risk for hepatitis B infection. Hepatitis C Testing is recommended for:  Everyone born from 1945 through 1965.  Anyone with known risk factors for hepatitis C. Sexually transmitted infections (STIs)  Get screened for STIs, including gonorrhea and chlamydia, if: ? You are sexually active and are younger than 40 years of age. ? You are older than 40 years of age and your health care provider tells you that you are at risk for this type of   infection. ? Your sexual activity has changed since you were last screened, and you are at increased risk for chlamydia or gonorrhea. Ask your health care provider if you are at risk.  Ask your health care provider about whether you are at high risk for HIV. Your health care provider may recommend a prescription medicine to help prevent HIV infection. If you choose to take medicine to prevent HIV, you should first get tested for HIV.  You should then be tested every 3 months for as long as you are taking the medicine. Pregnancy  If you are about to stop having your period (premenopausal) and you may become pregnant, seek counseling before you get pregnant.  Take 400 to 800 micrograms (mcg) of folic acid every day if you become pregnant.  Ask for birth control (contraception) if you want to prevent pregnancy. Osteoporosis and menopause Osteoporosis is a disease in which the bones lose minerals and strength with aging. This can result in bone fractures. If you are 73 years old or older, or if you are at risk for osteoporosis and fractures, ask your health care provider if you should:  Be screened for bone loss.  Take a calcium or vitamin D supplement to lower your risk of fractures.  Be given hormone replacement therapy (HRT) to treat symptoms of menopause. Follow these instructions at home: Lifestyle  Do not use any products that contain nicotine or tobacco, such as cigarettes, e-cigarettes, and chewing tobacco. If you need help quitting, ask your health care provider.  Do not use street drugs.  Do not share needles.  Ask your health care provider for help if you need support or information about quitting drugs. Alcohol use  Do not drink alcohol if: ? Your health care provider tells you not to drink. ? You are pregnant, may be pregnant, or are planning to become pregnant.  If you drink alcohol: ? Limit how much you use to 0-1 drink a day. ? Limit intake if you are breastfeeding.  Be aware of how much alcohol is in your drink. In the [place] one drink equals one 12 oz bottle of beer (355 mL), one 5 oz glass of wine (148 mL), or one 1 oz glass of hard liquor (44 mL). General instructions  Schedule regular health, dental, and eye exams.  Stay current with your vaccines.  Tell your health care provider if: ? You often feel depressed. ? You have ever been abused or do not feel safe at  home. Summary  Adopting a healthy lifestyle and getting preventive care are important in promoting health and wellness.  Follow your health care provider's instructions about healthy diet, exercising, and getting tested or screened for diseases.  Follow your health care provider's instructions on monitoring your cholesterol and blood pressure. This information is not intended to replace advice given to you by your health care provider. Make sure you discuss any questions you have with your health care provider. Document Revised: 05/23/2018 Document Reviewed: 05/23/2018 Elsevier Patient Education  2021 [employer]  Ulnar Nerve Contusion Rehab Ask your health care provider which exercises are safe for you. Do exercises exactly as told by your health care provider and adjust them as directed. It is normal to feel mild stretching, pulling, tightness, or discomfort as you do these exercises. Stop right away if you feel sudden pain or your pain gets worse. Do not begin these exercises until told by your health care provider. Stretching and range-of-motion exercises These exercises warm up your  muscles and joints and improve the movement and flexibility of your forearm. These exercises also help to relieve pain, numbness, and tingling. Wrist flexion, assisted 1. Stand over a tabletop with your left / right hand resting palm-up on the tabletop and your fingers pointing away from your body. Your arm should be extended, and there should be a slight bend in your elbow. 2. Gently press the back of your hand down (flexion) onto the table by straightening your elbow. You should feel a stretch on the top of your forearm. 3. Hold this position for __________ seconds. 4. Slowly return to the starting position. Repeat __________ times. Complete this exercise __________ times a day.   Wrist extension, assisted 1. Stand over a tabletop with your left / right hand resting palm-down on the tabletop and your  fingers pointing away from your body. Your arm should be extended, and there should be a slight bend in your elbow. 2. Gently press your fingers and palm down (extension) onto the table by straightening your elbow. You should feel a stretch on the inside of your forearm. 3. Hold this position for __________ seconds. 4. Slowly return to the starting position. Repeat __________ times. Complete this exercise __________ times a day.   Ulnar nerve glide, hand moving 1. Stand or sit with your left / right elbow bent and your hand at the height of your shoulder. 2. Move your elbow about 6 inches (15 cm) away from your body. 3. Gently and slowly wave your hand back and forth (nerve glide). 4. Repeat this motion for __________ seconds. Repeat __________ times. Complete this exercise __________ times a day. Ulnar nerve glide, elbow moving 1. Stand or sit with your left / right arm straight out to your side at shoulder height. 2. Bring your fingers up toward the ceiling so your palm faces away from you. 3. Bend your elbow and bring it to your side and bend your wrist so your palm now faces the floor. 4. Slowly go back and forth between doing the step 2 position and the step 3 position (elbow nerve glide). 5. Repeat these motions for __________ seconds. Repeat __________ times. Complete this exercise __________ times a day. Strengthening exercise This exercise builds strength and endurance in your forearm. Endurance is the ability to use your muscles for a long time, even after they get tired. Grip 1. Hold one of these items in your left / right hand: a dense sponge, a tennis ball, or a large, rolled sock. 2. Slowly squeeze as hard as you can without increasing any pain. 3. Hold this position for __________ seconds. 4. Slowly release your grip. Repeat __________ times. Complete this exercise __________ times a day.   This information is not intended to replace advice given to you by your health care  provider. Make sure you discuss any questions you have with your health care provider. Document Revised: 09/18/2018 Document Reviewed: 07/16/2018 Elsevier Patient Education  2021 [employer].

## 2020-08-07 NOTE — Progress Notes (Signed)
BP 110/73   Pulse 80   Temp 98 F (36.7 C)   Ht 5' 5.5" (1.664 m)   Wt 235 lb 9.6 oz (106.9 kg)   SpO2 99%   BMI 38.61 kg/m    Subjective:    Patient ID: Katrina Long, female    DOB: June 23, 1980, 40 y.o.   MRN: [id number]  HPI: Katrina Long is a 40 y.o. female presenting on 08/07/2020 for comprehensive medical examination. Current medical complaints include:  Has been doing OK. Still mourning her daughter. She never started her sertraline  Menopausal Symptoms: no  Depression Screen done today and results listed below:  Depression screen [facility] 2/9 08/07/2020 08/19/2019 07/22/2019 05/29/2018 05/11/2017  Decreased Interest 0 2 3 2  0  Down, Depressed, Hopeless 0 0 0 2 0  PHQ - 2 Score 0 2 3 4  0  Altered sleeping - 0 0 1 -  Tired, decreased energy - 3 3 2  -  Change in appetite - 0 2 0 -  Feeling bad or failure about yourself  - 0 2 1 -  Trouble concentrating - 3 0 0 -  Moving slowly or fidgety/restless - 1 0 0 -  Suicidal thoughts - 0 0 0 -  PHQ-9 Score - 9 10 8  -  Difficult doing work/chores - Somewhat difficult Very difficult Somewhat difficult -    Past Medical History:  Past Medical History:  Diagnosis Date  . Allergy   . Anemia   . History of abnormal cervical Pap smear   . Hypoglobulinemia   . Hypoproteinemia (HCC)     Surgical History:  History reviewed. No pertinent surgical history.  Medications:  Current Outpatient Medications on File Prior to Visit  Medication Sig  . Cranberry 300 MG tablet Take 1 tablet (300 mg total) by mouth 2 (two) times daily.  ketotifen (ZADITOR) 0.025 % ophthalmic solution INSTILL 1 DROP INTO BOTH EYES EVERY DAY  . triamcinolone (NASACORT ALLERGY 24HR) 55 MCG/ACT AERO nasal inhaler Place 2 sprays into the nose daily.  . Vitamin D, Ergocalciferol, (DRISDOL) 1.25 MG (50000 UNIT) CAPS capsule Take 1 capsule (50,000 Units total) by mouth every 7 (seven) days.   Current Facility-Administered Medications on File Prior to Visit  Medication  .  medroxyPROGESTERone (DEPO-PROVERA) injection 150 mg    Allergies:  Allergies  Allergen Reactions  . Amoxicillin Rash  . Entex Lq [Phenylephrine-Guaifenesin] Rash  . Penicillins Rash    Social History:  Social History   Socioeconomic History  . Marital status: Single    Spouse name: Not on file  . Number of children: Not on file  . Years of education: Not on file  . Highest education level: Not on file  Occupational History  . Not on file  Tobacco Use  . Smoking status: Never Smoker  . Smokeless tobacco: Never Used  Vaping Use  . Vaping Use: Never used  Substance and Sexual Activity  . Alcohol use: No  . Drug use: No  . Sexual activity: Yes  Other Topics Concern  . Not on file  Social History Narrative  . Not on file   Social Determinants of Health   Financial Resource Strain: Not on file  Food Insecurity: Not on file  Transportation Needs: Not on file  Physical Activity: Not on file  Stress: Not on file  Social Connections: Not on file  Intimate Partner Violence: Not on file   Social History   Tobacco Use  Smoking Status Never Smoker  Smokeless Tobacco Never Used   Social History   Substance and Sexual Activity  Alcohol Use No    Family History:  Family History  Problem Relation Age of Onset  . Mental illness Mother   . Lung disease Mother   . Diabetes Sister   . Mental illness Brother   . Hypertension Maternal Grandmother   . Cancer Maternal Aunt 50       Breast  . Cancer Daughter     Past medical history, surgical history, medications, allergies, family history and social history reviewed with patient today and changes made to appropriate areas of the chart.   Review of Systems  Constitutional: Negative.   HENT: Negative.   Eyes: Negative.   Respiratory: Negative.   Cardiovascular: Positive for palpitations. Negative for chest pain, orthopnea, claudication, leg swelling and PND.  Gastrointestinal: Positive for heartburn. Negative for  abdominal pain, blood in stool, constipation, diarrhea, melena, nausea and vomiting.  Genitourinary: Negative.   Musculoskeletal: Negative.   Skin: Negative.   Neurological: Negative.   Endo/Heme/Allergies: Negative.   Psychiatric/Behavioral: Negative for depression, hallucinations, memory loss, substance abuse and suicidal ideas. The patient is nervous/anxious. The patient does not have insomnia.    All other ROS negative except what is listed above and in the HPI.      Objective:    BP 110/73   Pulse 80   Temp 98 F (36.7 C)   Ht 5' 5.5" (1.664 m)   Wt 235 lb 9.6 oz (106.9 kg)   SpO2 99%   BMI 38.61 kg/m   Wt Readings from Last 3 Encounters:  08/07/20 235 lb 9.6 oz (106.9 kg)  07/22/19 220 lb (99.8 kg)  05/29/18 222 lb (100.7 kg)    Physical Exam Vitals and nursing note reviewed. Exam conducted with a chaperone present.  Constitutional:      General: She is not in acute distress.    Appearance: Normal appearance. She is not ill-appearing, toxic-appearing or diaphoretic.  HENT:     Head: Normocephalic and atraumatic.     Right Ear: Tympanic membrane, ear canal and external ear normal. There is no impacted cerumen.     Left Ear: Tympanic membrane, ear canal and external ear normal. There is no impacted cerumen.     Nose: Nose normal. No congestion or rhinorrhea.     Mouth/Throat:     Mouth: Mucous membranes are moist.     Pharynx: Oropharynx is clear. No oropharyngeal exudate or posterior oropharyngeal erythema.  Eyes:     General: No scleral icterus.       Right eye: No discharge.        Left eye: No discharge.     Extraocular Movements: Extraocular movements intact.     Conjunctiva/sclera: Conjunctivae normal.     Pupils: Pupils are equal, round, and reactive to light.  Neck:     Vascular: No carotid bruit.  Cardiovascular:     Rate and Rhythm: Normal rate and regular rhythm.     Pulses: Normal pulses.     Heart sounds: No murmur heard. No friction rub. No  gallop.   Pulmonary:     Effort: Pulmonary effort is normal. No respiratory distress.     Breath sounds: Normal breath sounds. No stridor. No wheezing, rhonchi or rales.  Chest:     Chest wall: No tenderness.  Breasts:     Right: Normal. No swelling, bleeding, inverted nipple, mass, nipple discharge, skin change or tenderness.     Left: Normal.  No swelling, bleeding, inverted nipple, mass, nipple discharge, skin change or tenderness.    Abdominal:     General: Abdomen is flat. Bowel sounds are normal. There is no distension.     Palpations: Abdomen is soft. There is no mass.     Tenderness: There is no abdominal tenderness. There is no right CVA tenderness, left CVA tenderness, guarding or rebound.     Hernia: No hernia is present.  Genitourinary:    General: Normal vulva.     Labia:        Right: No rash, tenderness, lesion or injury.        Left: No rash, tenderness, lesion or injury.      Urethra: No prolapse, urethral pain, urethral swelling or urethral lesion.     Vagina: Normal.     Cervix: Normal.     Uterus: Normal.      Adnexa: Right adnexa normal and left adnexa normal.  Musculoskeletal:        General: No swelling, tenderness, deformity or signs of injury.     Cervical back: Normal range of motion and neck supple. No rigidity. No muscular tenderness.     Right lower leg: No edema.     Left lower leg: No edema.  Lymphadenopathy:     Cervical: No cervical adenopathy.  Skin:    General: Skin is warm and dry.     Capillary Refill: Capillary refill takes less than 2 seconds.     Coloration: Skin is not jaundiced or pale.     Findings: No bruising, erythema, lesion or rash.  Neurological:     General: No focal deficit present.     Mental Status: She is alert and oriented to person, place, and time. Mental status is at baseline.     Cranial Nerves: No cranial nerve deficit.     Sensory: No sensory deficit.     Motor: No weakness.     Coordination: Coordination normal.      Gait: Gait normal.     Deep Tendon Reflexes: Reflexes normal.  Psychiatric:        Mood and Affect: Mood normal.        Behavior: Behavior normal.        Thought Content: Thought content normal.        Judgment: Judgment normal.     Results for orders placed or performed in visit on 07/22/19  Microscopic Examination   URINE  Result Value Ref Range   WBC, UA 0-5 0 - 5 /hpf   RBC 0-2 0 - 2 /hpf   Epithelial Cells (non renal) 0-10 0 - 10 /hpf   Bacteria, UA None seen None seen/Few  Urine Culture, Reflex   URINE  Result Value Ref Range   Urine Culture, Routine Final report    Organism ID, Bacteria Comment   CBC with Differential/Platelet  Result Value Ref Range   WBC 9.3 3.4 - 10.8 x10E3/uL   RBC 4.54 3.77 - 5.28 x10E6/uL   Hemoglobin 14.4 11.1 - 15.9 g/dL   Hematocrit 89.1 69.4 - 46.6 %   MCV 91 79 - 97 fL   MCH 31.7 26.6 - 33.0 pg   MCHC 34.8 31.5 - 35.7 g/dL   RDW 50.3 88.8 - 28.0 %   Platelets 243 150 - 450 x10E3/uL   Neutrophils 60 Not Estab. %   Lymphs 31 Not Estab. %   Monocytes 6 Not Estab. %   Eos 1 Not Estab. %   Basos 1  Not Estab. %   Neutrophils Absolute 5.7 1.4 - 7.0 x10E3/uL   Lymphocytes Absolute 2.9 0.7 - 3.1 x10E3/uL   Monocytes Absolute 0.5 0.1 - 0.9 x10E3/uL   EOS (ABSOLUTE) 0.1 0.0 - 0.4 x10E3/uL   Basophils Absolute 0.1 0.0 - 0.2 x10E3/uL   Immature Granulocytes 1 Not Estab. %   Immature Grans (Abs) 0.1 0.0 - 0.1 x10E3/uL  Comprehensive metabolic panel  Result Value Ref Range   Glucose 86 65 - 99 mg/dL   BUN 7 6 - 20 mg/dL   Creatinine, Ser 1.610.73 0.57 - 1.00 mg/dL   GFR calc non Af Amer 104 >59 mL/min/1.73   GFR calc Af Amer 120 >59 mL/min/1.73   BUN/Creatinine Ratio 10 9 - 23   Sodium 138 134 - 144 mmol/L   Potassium 4.1 3.5 - 5.2 mmol/L   Chloride 104 96 - 106 mmol/L   CO2 20 20 - 29 mmol/L   Calcium 9.6 8.7 - 10.2 mg/dL   Total Protein 7.2 6.0 - 8.5 g/dL   Albumin 4.6 3.8 - 4.8 g/dL   Globulin, Total 2.6 1.5 - 4.5 g/dL    Albumin/Globulin Ratio 1.8 1.2 - 2.2   Bilirubin Total 0.4 0.0 - 1.2 mg/dL   Alkaline Phosphatase 69 39 - 117 IU/L   AST 21 0 - 40 IU/L   ALT 25 0 - 32 IU/L  Lipid Panel w/o Chol/HDL Ratio  Result Value Ref Range   Cholesterol, Total 159 100 - 199 mg/dL   Triglycerides 096220 (H) 0 - 149 mg/dL   HDL 36 (L) >04>39 mg/dL   VLDL Cholesterol Cal 37 5 - 40 mg/dL   LDL Chol Calc (NIH) 86 0 - 99 mg/dL  Microalbumin, Urine Waived  Result Value Ref Range   Microalb, Ur Waived 30 (H) 0 - 19 mg/L   Creatinine, Urine Waived 200 10 - 300 mg/dL   Microalb/Creat Ratio <30 <30 mg/g  TSH  Result Value Ref Range   TSH 2.660 0.450 - 4.500 uIU/mL  UA/M w/rflx Culture, Routine   Specimen: Urine   URINE  Result Value Ref Range   Specific Gravity, UA 1.020 1.005 - 1.030   pH, UA 6.5 5.0 - 7.5   Color, UA Yellow Yellow   Appearance Ur Clear Clear   Leukocytes,UA 1+ (A) Negative   Protein,UA Negative Negative/Trace   Glucose, UA Negative Negative   Ketones, UA Negative Negative   RBC, UA Trace (A) Negative   Bilirubin, UA Negative Negative   Urobilinogen, Ur 0.2 0.2 - 1.0 mg/dL   Nitrite, UA Negative Negative   Microscopic Examination See below:    Urinalysis Reflex Comment   VITAMIN D 25 Hydroxy (Vit-D Deficiency, Fractures)  Result Value Ref Range   Vit D, 25-Hydroxy 10.5 (L) 30.0 - 100.0 ng/mL      Assessment & Plan:   Problem List Items Addressed This Visit   None   Visit Diagnoses    Routine general medical examination at a health care facility    -  Primary   Vaccines up to date/declined. Screening labs checked today. Pap done. Mammogram ordered. Continue diet and exercise. Call with any concerns.    Relevant Orders   CBC with Differential/Platelet   Comprehensive metabolic panel   Lipid Panel w/o Chol/HDL Ratio   Urinalysis, Routine w reflex microscopic   TSH   VITAMIN D 25 Hydroxy (Vit-D Deficiency, Fractures)   Cytology - PAP   Encounter for screening mammogram for malignant  neoplasm of breast  Mammogram ordered today.    Relevant Orders   MM 3D SCREEN BREAST BILATERAL   Screening for cervical cancer       Pap done today.   Encounter for Depo-Provera contraception       Relevant Medications   medroxyPROGESTERone (DEPO-PROVERA) injection 150 mg (Start on 11/04/2020 12:00 AM)       Follow up plan: Return in about 1 year (around 08/07/2021).   LABORATORY TESTING:  - Pap smear: pap done  IMMUNIZATIONS:   - Tdap: Tetanus vaccination status reviewed: last tetanus booster within 10 years. - Influenza: Up to date - COVID: Refused  SCREENING: -Mammogram: Up to date   PATIENT COUNSELING:   Advised to take 1 mg of folate supplement per day if capable of pregnancy.   Sexuality: Discussed sexually transmitted diseases, partner selection, use of condoms, avoidance of unintended pregnancy  and contraceptive alternatives.   Advised to avoid cigarette smoking.  I discussed with the patient that most people either abstain from alcohol or drink within safe limits (<=14/week and <=4 drinks/occasion for males, <=7/weeks and <= 3 drinks/occasion for females) and that the risk for alcohol disorders and other health effects rises proportionally with the number of drinks per week and how often a drinker exceeds daily limits.  Discussed cessation/primary prevention of drug use and availability of treatment for abuse.   Diet: Encouraged to adjust caloric intake to maintain  or achieve ideal body weight, to reduce intake of dietary saturated fat and total fat, to limit sodium intake by avoiding high sodium foods and not adding table salt, and to maintain adequate dietary potassium and calcium preferably from fresh fruits, vegetables, and low-fat dairy products.    stressed the importance of regular exercise  Injury prevention: Discussed safety belts, safety helmets, smoke detector, smoking near bedding or upholstery.   Dental health: Discussed importance of regular  tooth brushing, flossing, and dental visits.    NEXT PREVENTATIVE PHYSICAL DUE IN 1 YEAR. Return in about 1 year (around 08/07/2021).

## 2020-08-08 LAB — COMPREHENSIVE METABOLIC PANEL
ALT: 14 IU/L (ref 0–32)
AST: 18 IU/L (ref 0–40)
Albumin/Globulin Ratio: 1.8 (ref 1.2–2.2)
Albumin: 4.6 g/dL (ref 3.8–4.8)
Alkaline Phosphatase: 81 IU/L (ref 44–121)
BUN/Creatinine Ratio: 14 (ref 9–23)
BUN: 11 mg/dL (ref 6–24)
Bilirubin Total: 0.3 mg/dL (ref 0.0–1.2)
CO2: 20 mmol/L (ref 20–29)
Calcium: 9.4 mg/dL (ref 8.7–10.2)
Chloride: 101 mmol/L (ref 96–106)
Creatinine, Ser: 0.76 mg/dL (ref 0.57–1.00)
GFR calc Af Amer: 113 mL/min/{1.73_m2} (ref >59)
GFR calc non Af Amer: 98 mL/min/{1.73_m2} (ref >59)
Globulin, Total: 2.5 g/dL (ref 1.5–4.5)
Glucose: 90 mg/dL (ref 65–99)
Potassium: 4 mmol/L (ref 3.5–5.2)
Sodium: 140 mmol/L (ref 134–144)
Total Protein: 7.1 g/dL (ref 6.0–8.5)

## 2020-08-08 LAB — CBC WITH DIFFERENTIAL/PLATELET
Basophils Absolute: 0.1 10*3/uL (ref 0.0–0.2)
Basos: 1 %
EOS (ABSOLUTE): 0.1 10*3/uL (ref 0.0–0.4)
Eos: 1 %
Hematocrit: 44.2 % (ref 34.0–46.6)
Hemoglobin: 14.8 g/dL (ref 11.1–15.9)
Immature Grans (Abs): 0.1 10*3/uL (ref 0.0–0.1)
Immature Granulocytes: 1 %
Lymphocytes Absolute: 2.7 10*3/uL (ref 0.7–3.1)
Lymphs: 26 %
MCH: 30.5 pg (ref 26.6–33.0)
MCHC: 33.5 g/dL (ref 31.5–35.7)
MCV: 91 fL (ref 79–97)
Monocytes Absolute: 0.6 10*3/uL (ref 0.1–0.9)
Monocytes: 6 %
Neutrophils Absolute: 6.9 10*3/uL (ref 1.4–7.0)
Neutrophils: 65 %
Platelets: 252 10*3/uL (ref 150–450)
RBC: 4.85 x10E6/uL (ref 3.77–5.28)
RDW: 12.1 % (ref 11.7–15.4)
WBC: 10.4 10*3/uL (ref 3.4–10.8)

## 2020-08-08 LAB — LIPID PANEL W/O CHOL/HDL RATIO
Cholesterol, Total: 168 mg/dL (ref 100–199)
HDL: 33 mg/dL — ABNORMAL LOW (ref >39)
LDL Chol Calc (NIH): 81 mg/dL (ref 0–99)
Triglycerides: 328 mg/dL — ABNORMAL HIGH (ref 0–149)
VLDL Cholesterol Cal: 54 mg/dL — ABNORMAL HIGH (ref 5–40)

## 2020-08-08 LAB — TSH: TSH: 2.89 u[IU]/mL (ref 0.450–4.500)

## 2020-08-08 LAB — VITAMIN D 25 HYDROXY (VIT D DEFICIENCY, FRACTURES): Vit D, 25-Hydroxy: 14.9 ng/mL — ABNORMAL LOW (ref 30.0–100.0)

## 2020-08-10 ENCOUNTER — Other Ambulatory Visit: Payer: Self-pay | Admitting: Family Medicine

## 2020-08-10 MED ORDER — VITAMIN D (ERGOCALCIFEROL) 1.25 MG (50000 UNIT) PO CAPS: 50000.0000 [IU] | ORAL_CAPSULE | ORAL | 1 refills | Status: DC

## 2020-08-12 LAB — CYTOLOGY - PAP
Comment: NEGATIVE
Diagnosis: NEGATIVE
High risk HPV: NEGATIVE

## 2020-09-30 DIAGNOSIS — E559 Vitamin D deficiency, unspecified: Secondary | ICD-10-CM | POA: Insufficient documentation

## 2020-10-14 ENCOUNTER — Other Ambulatory Visit: Payer: Self-pay

## 2020-10-14 ENCOUNTER — Ambulatory Visit (INDEPENDENT_AMBULATORY_CARE_PROVIDER_SITE_OTHER): Payer: Managed Care, Other (non HMO) | Admitting: Family Medicine

## 2020-10-14 ENCOUNTER — Encounter: Payer: Self-pay | Admitting: Family Medicine

## 2020-10-14 VITALS — BP 102/69 | HR 76 | Temp 99.5°F | Wt 238.0 lb

## 2020-10-14 DIAGNOSIS — S70362A Insect bite (nonvenomous), left thigh, initial encounter: Secondary | ICD-10-CM

## 2020-10-14 DIAGNOSIS — L03116 Cellulitis of left lower limb: Secondary | ICD-10-CM | POA: Diagnosis not present

## 2020-10-14 DIAGNOSIS — W57XXXA Bitten or stung by nonvenomous insect and other nonvenomous arthropods, initial encounter: Secondary | ICD-10-CM

## 2020-10-14 MED ORDER — DOXYCYCLINE HYCLATE 100 MG PO TABS
100.0000 mg | ORAL_TABLET | Freq: Two times a day (BID) | ORAL | 0 refills | Status: DC
Start: 2020-10-14 — End: 2021-03-18

## 2020-10-14 NOTE — Progress Notes (Signed)
BP 102/69   Pulse 76   Temp 99.5 F (37.5 C)   Wt 238 lb (108 kg)   SpO2 95%   BMI 39.00 kg/m    Subjective:    Patient ID: Katrina Long, female    DOB: [birth year], 40 y.o.   MRN: [id number]  HPI: Katrina Long is a 40 y.o. female  Chief Complaint  Patient presents with  . Tick Removal    Patient states she removed a tick off of her 2 days ago. Area is not red, puffy and warm to the touch. Patient states area is itchy and painful   SKIN INFECTION Duration: 2 days Location: L thigh History of trauma in area: tick bite  Pain: yes Quality: aching and hot Severity: moderate Redness: yes Swelling: yes Oozing: no Pus: no Fevers: no Nausea/vomiting: no Status: worse Treatments attempted:warm compresses  Tetanus: UTD  Relevant past medical, surgical, family and social history reviewed and updated as indicated. Interim medical history since our last visit reviewed. Allergies and medications reviewed and updated.  Review of Systems  Constitutional: Negative.   Respiratory: Negative.   Cardiovascular: Negative.   Gastrointestinal: Negative.   Musculoskeletal: Negative.   Skin: Positive for color change and wound. Negative for pallor and rash.  Neurological: Negative.   Psychiatric/Behavioral: Negative.     Per HPI unless specifically indicated above     Objective:    BP 102/69   Pulse 76   Temp 99.5 F (37.5 C)   Wt 238 lb (108 kg)   SpO2 95%   BMI 39.00 kg/m   Wt Readings from Last 3 Encounters:  10/14/20 238 lb (108 kg)  08/07/20 235 lb 9.6 oz (106.9 kg)  07/22/19 220 lb (99.8 kg)    Physical Exam Vitals and nursing note reviewed.  Constitutional:      General: She is not in acute distress.    Appearance: Normal appearance. She is not ill-appearing, toxic-appearing or diaphoretic.  HENT:     Head: Normocephalic and atraumatic.     Right Ear: External ear normal.     Left Ear: External ear normal.     Nose: Nose normal.     Mouth/Throat:     Mouth:  Mucous membranes are moist.     Pharynx: Oropharynx is clear.  Eyes:     General: No scleral icterus.       Right eye: No discharge.        Left eye: No discharge.     Extraocular Movements: Extraocular movements intact.     Conjunctiva/sclera: Conjunctivae normal.     Pupils: Pupils are equal, round, and reactive to light.  Cardiovascular:     Rate and Rhythm: Normal rate and regular rhythm.     Pulses: Normal pulses.     Heart sounds: Normal heart sounds. No murmur heard. No friction rub. No gallop.   Pulmonary:     Effort: Pulmonary effort is normal. No respiratory distress.     Breath sounds: Normal breath sounds. No stridor. No wheezing, rhonchi or rales.  Chest:     Chest wall: No tenderness.  Musculoskeletal:        General: Normal range of motion.     Cervical back: Normal range of motion and neck supple.  Skin:    General: Skin is warm and dry.     Capillary Refill: Capillary refill takes less than 2 seconds.     Coloration: Skin is not jaundiced or pale.  Findings: Erythema (heat and redness around tick bite on anterior L thigh) present. No bruising, lesion or rash.  Neurological:     General: No focal deficit present.     Mental Status: She is alert and oriented to person, place, and time. Mental status is at baseline.  Psychiatric:        Mood and Affect: Mood normal.        Behavior: Behavior normal.        Thought Content: Thought content normal.        Judgment: Judgment normal.     Results for orders placed or performed in visit on 08/07/20  Microscopic Examination   Urine  Result Value Ref Range   WBC, UA >30 (A) 0 - 5 /hpf   RBC 0-2 0 - 2 /hpf   Epithelial Cells (non renal) >10 (A) 0 - 10 /hpf   Renal Epithel, UA None seen None seen /hpf   Casts Present (A) None seen /lpf   Cast Type Hyaline casts N/A   Crystals None seen N/A   Crystal Type None seen N/A   Mucus, UA None seen Not Estab.   Bacteria, UA Moderate (A) None seen/Few   Yeast, UA None  seen None seen   Trichomonas, UA None seen None seen  CBC with Differential/Platelet  Result Value Ref Range   WBC 10.4 3.4 - 10.8 x10E3/uL   RBC 4.85 3.77 - 5.28 x10E6/uL   Hemoglobin 14.8 11.1 - 15.9 g/dL   Hematocrit 54.6 27.0 - 46.6 %   MCV 91 79 - 97 fL   MCH 30.5 26.6 - 33.0 pg   MCHC 33.5 31.5 - 35.7 g/dL   RDW 35.0 09.3 - 81.8 %   Platelets 252 150 - 450 x10E3/uL   Neutrophils 65 Not Estab. %   Lymphs 26 Not Estab. %   Monocytes 6 Not Estab. %   Eos 1 Not Estab. %   Basos 1 Not Estab. %   Neutrophils Absolute 6.9 1.4 - 7.0 x10E3/uL   Lymphocytes Absolute 2.7 0.7 - 3.1 x10E3/uL   Monocytes Absolute 0.6 0.1 - 0.9 x10E3/uL   EOS (ABSOLUTE) 0.1 0.0 - 0.4 x10E3/uL   Basophils Absolute 0.1 0.0 - 0.2 x10E3/uL   Immature Granulocytes 1 Not Estab. %   Immature Grans (Abs) 0.1 0.0 - 0.1 x10E3/uL  Comprehensive metabolic panel  Result Value Ref Range   Glucose 90 65 - 99 mg/dL   BUN 11 6 - 24 mg/dL   Creatinine, Ser 2.99 0.57 - 1.00 mg/dL   GFR calc non Af Amer 98 >59 mL/min/1.73   GFR calc Af Amer 113 >59 mL/min/1.73   BUN/Creatinine Ratio 14 9 - 23   Sodium 140 134 - 144 mmol/L   Potassium 4.0 3.5 - 5.2 mmol/L   Chloride 101 96 - 106 mmol/L   CO2 20 20 - 29 mmol/L   Calcium 9.4 8.7 - 10.2 mg/dL   Total Protein 7.1 6.0 - 8.5 g/dL   Albumin 4.6 3.8 - 4.8 g/dL   Globulin, Total 2.5 1.5 - 4.5 g/dL   Albumin/Globulin Ratio 1.8 1.2 - 2.2   Bilirubin Total 0.3 0.0 - 1.2 mg/dL   Alkaline Phosphatase 81 44 - 121 IU/L   AST 18 0 - 40 IU/L   ALT 14 0 - 32 IU/L  Lipid Panel w/o Chol/HDL Ratio  Result Value Ref Range   Cholesterol, Total 168 100 - 199 mg/dL   Triglycerides 371 (H) 0 - 149 mg/dL  HDL 33 (L) >39 mg/dL   VLDL Cholesterol Cal 54 (H) 5 - 40 mg/dL   LDL Chol Calc (NIH) 81 0 - 99 mg/dL  Urinalysis, Routine w reflex microscopic  Result Value Ref Range   Specific Gravity, UA 1.020 1.005 - 1.030   pH, UA 7.0 5.0 - 7.5   Color, UA Yellow Yellow   Appearance Ur  Cloudy (A) Clear   Leukocytes,UA 3+ (A) Negative   Protein,UA 1+ (A) Negative/Trace   Glucose, UA Negative Negative   Ketones, UA Trace (A) Negative   RBC, UA Trace (A) Negative   Bilirubin, UA Negative Negative   Urobilinogen, Ur 1.0 0.2 - 1.0 mg/dL   Nitrite, UA Negative Negative   Microscopic Examination See below:   TSH  Result Value Ref Range   TSH 2.890 0.450 - 4.500 uIU/mL  VITAMIN D 25 Hydroxy (Vit-D Deficiency, Fractures)  Result Value Ref Range   Vit D, 25-Hydroxy 14.9 (L) 30.0 - 100.0 ng/mL  Cytology - PAP  Result Value Ref Range   High risk HPV Negative    Adequacy      Satisfactory for evaluation; transformation zone component PRESENT.   Diagnosis      - Negative for intraepithelial lesion or malignancy (NILM)   Comment Normal Reference Range HPV - Negative       Assessment & Plan:   Problem List Items Addressed This Visit   None   Visit Diagnoses    Cellulitis of left lower extremity    -  Primary   Will treat with doxycycline- empirically for RMSF for 3 weeks. Call if not getting better or getting worse.    Tick bite of left thigh, initial encounter       Will treat empirically with doxycycline. Checking labs. Await results. Treat as needed.    Relevant Orders   Lyme Ab/Western Blot Reflex   Rocky mtn spotted fvr abs pnl(IgG+IgM)   Ehrlichia Antibody Panel   Babesia microti Antibody Panel       Follow up plan: Return if symptoms worsen or fail to improve.

## 2020-10-15 LAB — LYME DISEASE SEROLOGY W/REFLEX: Lyme Total Antibody EIA: NEGATIVE

## 2020-10-17 LAB — EHRLICHIA ANTIBODY PANEL
E. Chaffeensis (HME) IgM Titer: NEGATIVE
E.Chaffeensis (HME) IgG: NEGATIVE
HGE IgG Titer: NEGATIVE
HGE IgM Titer: NEGATIVE

## 2020-10-17 LAB — ROCKY MTN SPOTTED FVR ABS PNL(IGG+IGM)
RMSF IgG: NEGATIVE
RMSF IgM: 0.56 index (ref 0.00–0.89)

## 2020-10-17 LAB — BABESIA MICROTI ANTIBODY PANEL
Babesia microti IgG: <1:10 {titer}
Babesia microti IgM: <1:10 {titer}

## 2020-10-23 ENCOUNTER — Ambulatory Visit (INDEPENDENT_AMBULATORY_CARE_PROVIDER_SITE_OTHER): Payer: Managed Care, Other (non HMO)

## 2020-10-23 ENCOUNTER — Other Ambulatory Visit: Payer: Self-pay

## 2020-10-23 DIAGNOSIS — Z3042 Encounter for surveillance of injectable contraceptive: Secondary | ICD-10-CM | POA: Diagnosis not present

## 2020-10-23 DIAGNOSIS — Z308 Encounter for other contraceptive management: Secondary | ICD-10-CM

## 2021-01-15 ENCOUNTER — Ambulatory Visit (INDEPENDENT_AMBULATORY_CARE_PROVIDER_SITE_OTHER): Payer: Managed Care, Other (non HMO)

## 2021-01-15 ENCOUNTER — Other Ambulatory Visit: Payer: Self-pay

## 2021-01-15 DIAGNOSIS — Z3042 Encounter for surveillance of injectable contraceptive: Secondary | ICD-10-CM

## 2021-01-15 DIAGNOSIS — Z308 Encounter for other contraceptive management: Secondary | ICD-10-CM

## 2021-03-18 ENCOUNTER — Other Ambulatory Visit: Payer: Self-pay

## 2021-03-18 ENCOUNTER — Encounter: Payer: Self-pay | Admitting: Family Medicine

## 2021-03-18 ENCOUNTER — Ambulatory Visit (INDEPENDENT_AMBULATORY_CARE_PROVIDER_SITE_OTHER): Payer: Managed Care, Other (non HMO) | Admitting: Family Medicine

## 2021-03-18 VITALS — BP 100/70 | HR 76 | Temp 99.3°F | Resp 16 | Ht 66.0 in | Wt 235.0 lb

## 2021-03-18 DIAGNOSIS — G43001 Migraine without aura, not intractable, with status migrainosus: Secondary | ICD-10-CM

## 2021-03-18 MED ORDER — KETOROLAC TROMETHAMINE 60 MG/2ML IM SOLN: 60.0000 mg | Freq: Once | INTRAMUSCULAR | Status: AC

## 2021-03-18 MED ORDER — SUMATRIPTAN SUCCINATE 100 MG PO TABS: 50.0000 mg | ORAL_TABLET | ORAL | 12 refills | Status: DC | PRN

## 2021-03-18 NOTE — Progress Notes (Signed)
BP 100/70 (BP Location: Left Arm, Patient Position: Sitting, Cuff Size: Large)   Pulse 76   Temp 99.3 F (37.4 C) (Oral)   Resp 16   Ht 5\' 6"  (1.676 m)   Wt 235 lb (106.6 kg)   SpO2 98%   BMI 37.93 kg/m    Subjective:    Patient ID: , female    DOB: [birth year], 40 y.o.   MRN: [id number]  HPI: Katrina Long is a 40 y.o. female  Chief Complaint  Patient presents with   Migraine   Has been under a huge amount of stress with work.  MIGRAINES Duration: 2 days Onset: sudden Severity: severe Quality: throbbing Frequency: constant Location: all over Headache duration: 2 days Radiation: no Alleviating factors: nothing Aggravating factors: stress Headache status at time of visit: current headache Treatments attempted: Treatments attempted: rest, ice, heat, APAP, ibuprofen, and aleve", excedrine   Aura: no Nausea:  yes Vomiting: yes Photophobia:  yes Phonophobia:  yes Effect on social functioning:  yes Confusion:  no Gait disturbance/ataxia:  no Behavioral changes:  no Fevers:  no  Relevant past medical, surgical, family and social history reviewed and updated as indicated. Interim medical history since our last visit reviewed. Allergies and medications reviewed and updated.  Review of Systems  Constitutional: Negative.   Respiratory: Negative.    Cardiovascular: Negative.   Gastrointestinal: Negative.   Musculoskeletal: Negative.   Neurological: Negative.   Psychiatric/Behavioral: Negative.     Per HPI unless specifically indicated above     Objective:    BP 100/70 (BP Location: Left Arm, Patient Position: Sitting, Cuff Size: Large)   Pulse 76   Temp 99.3 F (37.4 C) (Oral)   Resp 16   Ht 5\' 6"  (1.676 m)   Wt 235 lb (106.6 kg)   SpO2 98%   BMI 37.93 kg/m   Wt Readings from Last 3 Encounters:  03/18/21 235 lb (106.6 kg)  10/14/20 238 lb (108 kg)  08/07/20 235 lb 9.6 oz (106.9 kg)    Physical Exam Vitals and nursing note reviewed.   Constitutional:      General: She is not in acute distress.    Appearance: Normal appearance. She is not ill-appearing, toxic-appearing or diaphoretic.  HENT:     Head: Normocephalic and atraumatic.     Right Ear: External ear normal.     Left Ear: External ear normal.     Nose: Nose normal.     Mouth/Throat:     Mouth: Mucous membranes are moist.     Pharynx: Oropharynx is clear.  Eyes:     General: No scleral icterus.       Right eye: No discharge.        Left eye: No discharge.     Extraocular Movements: Extraocular movements intact.     Conjunctiva/sclera: Conjunctivae normal.     Pupils: Pupils are equal, round, and reactive to light.  Cardiovascular:     Rate and Rhythm: Normal rate and regular rhythm.     Pulses: Normal pulses.     Heart sounds: Normal heart sounds. No murmur heard.   No friction rub. No gallop.  Pulmonary:     Effort: Pulmonary effort is normal. No respiratory distress.     Breath sounds: Normal breath sounds. No stridor. No wheezing, rhonchi or rales.  Chest:     Chest wall: No tenderness.  Musculoskeletal:        General: Normal range of motion.  Cervical back: Normal range of motion and neck supple.  Skin:    General: Skin is warm and dry.     Capillary Refill: Capillary refill takes less than 2 seconds.     Coloration: Skin is not jaundiced or pale.     Findings: No bruising, erythema, lesion or rash.  Neurological:     General: No focal deficit present.     Mental Status: She is alert and oriented to person, place, and time. Mental status is at baseline.  Psychiatric:        Mood and Affect: Mood normal.        Behavior: Behavior normal.        Thought Content: Thought content normal.        Judgment: Judgment normal.    Results for orders placed or performed in visit on 10/14/20  Rocky mtn spotted fvr abs pnl(IgG+IgM)  Result Value Ref Range   RMSF IgG Negative Negative   RMSF IgM 0.56 0.00 - 0.89 index  Ehrlichia Antibody Panel   Result Value Ref Range   E.Chaffeensis (HME) IgG Negative Neg:<1:64   E. Chaffeensis (HME) IgM Titer Negative Neg:<1:20   HGE IgG Titer Negative Neg:<1:64   HGE IgM Titer Negative Neg:<1:20  Babesia microti Antibody Panel  Result Value Ref Range   Babesia microti IgM <1:10 Neg:<1:10   Babesia microti IgG <1:10 Neg:<1:10  Lyme Disease Serology w/Reflex  Result Value Ref Range   Lyme Total Antibody EIA Negative Negative      Assessment & Plan:   Problem List Items Addressed This Visit   None Visit Diagnoses     Migraine without aura and with status migrainosus, not intractable    -  Primary   Toradol shot today. Start imitrex as needed. Work on stress. Call with any concerns or if not getting better.   Relevant Medications   ketorolac (TORADOL) injection 60 mg   SUMAtriptan (IMITREX) 100 MG tablet        Follow up plan: Return if symptoms worsen or fail to improve.

## 2021-04-02 ENCOUNTER — Encounter: Payer: Self-pay | Admitting: Family Medicine

## 2021-04-09 ENCOUNTER — Ambulatory Visit (INDEPENDENT_AMBULATORY_CARE_PROVIDER_SITE_OTHER): Payer: Managed Care, Other (non HMO)

## 2021-04-09 ENCOUNTER — Other Ambulatory Visit: Payer: Self-pay

## 2021-04-09 DIAGNOSIS — Z3042 Encounter for surveillance of injectable contraceptive: Secondary | ICD-10-CM | POA: Diagnosis not present

## 2021-04-09 DIAGNOSIS — Z308 Encounter for other contraceptive management: Secondary | ICD-10-CM

## 2021-04-12 ENCOUNTER — Ambulatory Visit
Admission: RE | Admit: 2021-04-12 | Discharge: 2021-04-12 | Disposition: A | Discharge status: Home / Self Care | Payer: Managed Care, Other (non HMO) | Source: Ambulatory Visit | Attending: Family Medicine | Admitting: Family Medicine

## 2021-04-12 ENCOUNTER — Other Ambulatory Visit: Payer: Self-pay

## 2021-04-12 DIAGNOSIS — Z1231 Encounter for screening mammogram for malignant neoplasm of breast: Secondary | ICD-10-CM | POA: Insufficient documentation

## 2021-04-21 ENCOUNTER — Encounter: Payer: Self-pay | Admitting: Family Medicine

## 2021-04-21 ENCOUNTER — Telehealth: Payer: Self-pay | Admitting: Family Medicine

## 2021-04-21 ENCOUNTER — Telehealth (INDEPENDENT_AMBULATORY_CARE_PROVIDER_SITE_OTHER): Payer: Managed Care, Other (non HMO) | Admitting: Family Medicine

## 2021-04-21 DIAGNOSIS — F4323 Adjustment disorder with mixed anxiety and depressed mood: Secondary | ICD-10-CM | POA: Diagnosis not present

## 2021-04-21 NOTE — Telephone Encounter (Signed)
Pt dropped off work note needed for Mychart visit today with Dr. Laural Benes. Placed in provider's folder.

## 2021-04-21 NOTE — Progress Notes (Signed)
There were no vitals taken for this visit.   Subjective:    Patient ID: Katrina Long, female    DOB: 02-23-1981, 40 y.o.   MRN: [id number]  HPI: Katrina Long is a 40 y.o. female  Chief Complaint  Patient presents with   work accomidations    Patient brought in letter from job that is asking whether patient can perform job with certain accommodations.    Katrina Long has been off work for a week as her job says that every building has some of the ethylene oxide in it. She has been offered another job, so feels like she only needs to work with her current employer for about 3 weeks. She notes that she only has panic attacks when she stays in 1 particular building for >30 minutes. She would like an adjusted letter to get her through the next few weeks until she gets her new position. No other concerns or complaints at this time.   Relevant past medical, surgical, family and social history reviewed and updated as indicated. Interim medical history since our last visit reviewed. Allergies and medications reviewed and updated.  Review of Systems  Constitutional: Negative.   Respiratory: Negative.    Cardiovascular: Negative.   Gastrointestinal: Negative.   Neurological: Negative.   Psychiatric/Behavioral: Negative.     Per HPI unless specifically indicated above     Objective:    There were no vitals taken for this visit.  Wt Readings from Last 3 Encounters:  03/18/21 235 lb (106.6 kg)  10/14/20 238 lb (108 kg)  08/07/20 235 lb 9.6 oz (106.9 kg)    Physical Exam Vitals and nursing note reviewed.  Constitutional:      General: She is not in acute distress.    Appearance: Normal appearance. She is not ill-appearing, toxic-appearing or diaphoretic.  HENT:     Head: Normocephalic and atraumatic.     Right Ear: External ear normal.     Left Ear: External ear normal.     Nose: Nose normal.     Mouth/Throat:     Mouth: Mucous membranes are moist.     Pharynx: Oropharynx is clear.  Eyes:      General: No scleral icterus.       Right eye: No discharge.        Left eye: No discharge.     Conjunctiva/sclera: Conjunctivae normal.     Pupils: Pupils are equal, round, and reactive to light.  Pulmonary:     Effort: Pulmonary effort is normal. No respiratory distress.     Comments: Speaking in full sentences Musculoskeletal:        General: Normal range of motion.     Cervical back: Normal range of motion.  Skin:    Coloration: Skin is not jaundiced or pale.     Findings: No bruising, erythema, lesion or rash.  Neurological:     Mental Status: She is alert and oriented to person, place, and time. Mental status is at baseline.  Psychiatric:        Mood and Affect: Mood normal.        Behavior: Behavior normal.        Thought Content: Thought content normal.        Judgment: Judgment normal.    Results for orders placed or performed in visit on 10/14/20  Rocky mtn spotted fvr abs pnl(IgG+IgM)  Result Value Ref Range   RMSF IgG Negative Negative   RMSF IgM 0.56 0.00 - 0.89  index  Ehrlichia Antibody Panel  Result Value Ref Range   E.Chaffeensis (HME) IgG Negative Neg:<1:64   E. Chaffeensis (HME) IgM Titer Negative Neg:<1:20   HGE IgG Titer Negative Neg:<1:64   HGE IgM Titer Negative Neg:<1:20  Babesia microti Antibody Panel  Result Value Ref Range   Babesia microti IgM <1:10 Neg:<1:10   Babesia microti IgG <1:10 Neg:<1:10  Lyme Disease Serology w/Reflex  Result Value Ref Range   Lyme Total Antibody EIA Negative Negative      Assessment & Plan:   Problem List Items Addressed This Visit       Other   Adjustment disorder with mixed anxiety and depressed mood - Primary    Will provide letter of accomodation. Continue to monitor. Call with any concerns. Continue to monitor.         Follow up plan: Return if symptoms worsen or fail to improve.   This visit was completed via video visit through MyChart due to the restrictions of the COVID-19 pandemic. All issues  as above were discussed and addressed. Physical exam was done as above through visual confirmation on video through MyChart. If it was felt that the patient should be evaluated in the office, they were directed there. The patient verbally consented to this visit. Location of the patient: home Location of the provider: work Those involved with this call:  Provider: Olevia Perches, DO CMA: Rolley Sims, CMA Front Desk/Registration: [employer]  Time spent on call:  15 minutes with patient face to face via video conference. More than 50% of this time was spent in counseling and coordination of care. 23 minutes total spent in review of patient's record and preparation of their chart.

## 2021-04-21 NOTE — Assessment & Plan Note (Signed)
Will provide letter of accomodation. Continue to monitor. Call with any concerns. Continue to monitor.

## 2021-09-13 ENCOUNTER — Encounter: Payer: Self-pay | Admitting: Family Medicine

## 2021-09-13 ENCOUNTER — Ambulatory Visit (INDEPENDENT_AMBULATORY_CARE_PROVIDER_SITE_OTHER): Payer: Managed Care, Other (non HMO) | Admitting: Family Medicine

## 2021-09-13 VITALS — BP 104/71 | HR 90 | Temp 98.5°F | Ht 65.9 in | Wt 230.6 lb

## 2021-09-13 DIAGNOSIS — E559 Vitamin D deficiency, unspecified: Secondary | ICD-10-CM

## 2021-09-13 DIAGNOSIS — Z9109 Other allergy status, other than to drugs and biological substances: Secondary | ICD-10-CM | POA: Diagnosis not present

## 2021-09-13 DIAGNOSIS — Z0000 Encounter for general adult medical examination without abnormal findings: Secondary | ICD-10-CM | POA: Diagnosis not present

## 2021-09-13 DIAGNOSIS — E221 Hyperprolactinemia: Secondary | ICD-10-CM

## 2021-09-13 LAB — URINALYSIS, ROUTINE W REFLEX MICROSCOPIC
Bilirubin, UA: NEGATIVE
Glucose, UA: NEGATIVE
Ketones, UA: NEGATIVE
Nitrite, UA: NEGATIVE
RBC, UA: NEGATIVE
Specific Gravity, UA: 1.025 (ref 1.005–1.030)
Urobilinogen, Ur: 1 mg/dL (ref 0.2–1.0)
pH, UA: 7 (ref 5.0–7.5)

## 2021-09-13 LAB — BAYER DCA HB A1C WAIVED: HB A1C (BAYER DCA - WAIVED): 4.8 % (ref 4.8–5.6)

## 2021-09-13 LAB — MICROSCOPIC EXAMINATION: Bacteria, UA: NONE SEEN

## 2021-09-13 NOTE — Assessment & Plan Note (Signed)
Rechecking labs today. Await results. Treat as needed.  °

## 2021-09-13 NOTE — Assessment & Plan Note (Signed)
Under good control on current regimen. Continue current regimen. Continue to monitor. Call with any concerns. Refills given.   

## 2021-09-13 NOTE — Progress Notes (Signed)
? ?BP 104/71   Pulse 90   Temp 98.5 ?F (36.9 ?C) (Oral)   Ht 5' 5.9" (1.674 m)   Wt 230 lb 9.6 oz (104.6 kg)   SpO2 98%   BMI 37.33 kg/m?   ? ?Subjective:  ? ? Patient ID: Katrina Long, female    DOB: 01/05/1981, 41 y.o.   MRN: [id number] ? ?HPI: ?Katrina Long Carlton is a 41 y.o. female presenting on 09/13/2021 for comprehensive medical examination. Current medical complaints include:none ? ?Menopausal Symptoms: no ? ?Depression Screen done today and results listed below:  ? ?  09/13/2021  ?  4:07 PM 08/07/2020  ?  3:04 PM 08/19/2019  ?  9:31 AM 07/22/2019  ?  3:39 PM 05/29/2018  ?  2:25 PM  ?Depression screen PHQ 2/9  ?Decreased Interest 1 0 2 3 2   ?Down, Depressed, Hopeless 1 0 0 0 2  ?PHQ - 2 Score 2 0 2 3 4   ?Altered sleeping 0  0 0 1  ?Tired, decreased energy 1  3 3 2   ?Change in appetite 2  0 2 0  ?Feeling bad or failure about yourself  0  0 2 1  ?Trouble concentrating 0  3 0 0  ?Moving slowly or fidgety/restless 0  1 0 0  ?Suicidal thoughts 0  0 0 0  ?PHQ-9 Score 5  9 10 8   ?Difficult doing work/chores Somewhat difficult  Somewhat difficult Very difficult Somewhat difficult  ? ? ? ?Past Medical History:  ?Past Medical History:  ?Diagnosis Date  ? Allergy   ? Anemia   ? History of abnormal cervical Pap smear   ? Hypoglobulinemia   ? Hypoproteinemia (HCC)   ? ? ?Surgical History:  ?History reviewed. No pertinent surgical history. ? ?Medications:  ?Current Outpatient Medications on File Prior to Visit  ?Medication Sig  ? cetirizine (ZYRTEC) 10 MG tablet TAKE 1 TABLET(10 MG) BY MOUTH DAILY  ? Cranberry 300 MG tablet Take 1 tablet (300 mg total) by mouth 2 (two) times daily.  ? ketotifen (ZADITOR) 0.025 % ophthalmic solution INSTILL 1 DROP INTO BOTH EYES EVERY DAY  ? montelukast (SINGULAIR) 10 MG tablet TAKE 1 TABLET(10 MG) BY MOUTH AT BEDTIME  ? ondansetron (ZOFRAN ODT) 4 MG disintegrating tablet Take 1 tablet (4 mg total) by mouth every 8 (eight) hours as needed for nausea or vomiting.  ? Probiotic Product (PROBIOTIC-10) CHEW  Chew 1 Dose by mouth daily.  ? SUMAtriptan (IMITREX) 100 MG tablet Take 0.5-1 tablets (50-100 mg total) by mouth every 2 (two) hours as needed for migraine. May repeat in 2 hours if headache persists or recurs.  ? triamcinolone (NASACORT ALLERGY 24HR) 55 MCG/ACT AERO nasal inhaler Place 2 sprays into the nose daily.  ? ?Current Facility-Administered Medications on File Prior to Visit  ?Medication  ? medroxyPROGESTERone (DEPO-PROVERA) injection 150 mg  ? medroxyPROGESTERone (DEPO-PROVERA) injection 150 mg  ? ? ?Allergies:  ?Allergies  ?Allergen Reactions  ? Amoxicillin Rash  ? Entex Lq [Phenylephrine-Guaifenesin] Rash  ? Penicillins Rash  ? ? ?Social History:  ?Social History  ? ?Socioeconomic History  ? Marital status: Single  ?  Spouse name: Not on file  ? Number of children: Not on file  ? Years of education: Not on file  ? Highest education level: Not on file  ?Occupational History  ? Not on file  ?Tobacco Use  ? Smoking status: Never  ? Smokeless tobacco: Never  ?Vaping Use  ? Vaping Use: Never used  ?Substance  and Sexual Activity  ? Alcohol use: No  ? Drug use: No  ? Sexual activity: Yes  ?Other Topics Concern  ? Not on file  ?Social History Narrative  ? Not on file  ? ?Social Determinants of Health  ? ?Financial Resource Strain: Not on file  ?Food Insecurity: Not on file  ?Transportation Needs: Not on file  ?Physical Activity: Not on file  ?Stress: Not on file  ?Social Connections: Not on file  ?Intimate Partner Violence: Not on file  ? ?Social History  ? ?Tobacco Use  ?Smoking Status Never  ?Smokeless Tobacco Never  ? ?Social History  ? ?Substance and Sexual Activity  ?Alcohol Use No  ? ? ?Family History:  ?Family History  ?Problem Relation Age of Onset  ? Mental illness Mother   ? Lung disease Mother   ? Diabetes Sister   ? Cancer Daughter   ? Breast cancer Maternal Aunt   ? Cancer Maternal Aunt 50  ?     Breast  ? Hypertension Maternal Grandmother   ? Mental illness Brother   ? ? ?Past medical history,  surgical history, medications, allergies, family history and social history reviewed with patient today and changes made to appropriate areas of the chart.  ? ?Review of Systems  ?Constitutional:  Positive for malaise/fatigue. Negative for chills, diaphoresis, fever and weight loss.  ?HENT: Negative.    ?Eyes: Negative.   ?Respiratory: Negative.    ?Cardiovascular: Negative.   ?Gastrointestinal: Negative.   ?Genitourinary: Negative.   ?Musculoskeletal: Negative.   ?Skin: Negative.   ?Neurological:  Positive for dizziness and headaches. Negative for tingling, tremors, sensory change, speech change, focal weakness, seizures, loss of consciousness and weakness.  ?Endo/Heme/Allergies:  Positive for environmental allergies and polydipsia. Does not bruise/bleed easily.  ?Psychiatric/Behavioral: Negative.    ?All other ROS negative except what is listed above and in the HPI.  ? ?   ?Objective:  ?  ?BP 104/71   Pulse 90   Temp 98.5 ?F (36.9 ?C) (Oral)   Ht 5' 5.9" (1.674 m)   Wt 230 lb 9.6 oz (104.6 kg)   SpO2 98%   BMI 37.33 kg/m?   ?Wt Readings from Last 3 Encounters:  ?09/13/21 230 lb 9.6 oz (104.6 kg)  ?03/18/21 235 lb (106.6 kg)  ?10/14/20 238 lb (108 kg)  ?  ?Physical Exam ?Vitals and nursing note reviewed.  ?Constitutional:   ?   General: She is not in acute distress. ?   Appearance: Normal appearance. She is not ill-appearing, toxic-appearing or diaphoretic.  ?HENT:  ?   Head: Normocephalic and atraumatic.  ?   Right Ear: Tympanic membrane, ear canal and external ear normal. There is no impacted cerumen.  ?   Left Ear: Tympanic membrane, ear canal and external ear normal. There is no impacted cerumen.  ?   Nose: Nose normal. No congestion or rhinorrhea.  ?   Mouth/Throat:  ?   Mouth: Mucous membranes are moist.  ?   Pharynx: Oropharynx is clear. No oropharyngeal exudate or posterior oropharyngeal erythema.  ?Eyes:  ?   General: No scleral icterus.    ?   Right eye: No discharge.     ?   Left eye: No discharge.   ?   Extraocular Movements: Extraocular movements intact.  ?   Conjunctiva/sclera: Conjunctivae normal.  ?   Pupils: Pupils are equal, round, and reactive to light.  ?Neck:  ?   Vascular: No carotid bruit.  ?Cardiovascular:  ?  Rate and Rhythm: Normal rate and regular rhythm.  ?   Pulses: Normal pulses.  ?   Heart sounds: No murmur heard. ?  No friction rub. No gallop.  ?Pulmonary:  ?   Effort: Pulmonary effort is normal. No respiratory distress.  ?   Breath sounds: Normal breath sounds. No stridor. No wheezing, rhonchi or rales.  ?Chest:  ?   Chest wall: No tenderness.  ?Abdominal:  ?   General: Abdomen is flat. Bowel sounds are normal. There is no distension.  ?   Palpations: Abdomen is soft. There is no mass.  ?   Tenderness: There is no abdominal tenderness. There is no right CVA tenderness, left CVA tenderness, guarding or rebound.  ?   Hernia: No hernia is present.  ?Genitourinary: ?   Comments: Breast and pelvic exams deferred with shared decision making ?Musculoskeletal:     ?   General: No swelling, tenderness, deformity or signs of injury. Normal range of motion.  ?   Cervical back: Normal range of motion and neck supple. No rigidity. No muscular tenderness.  ?   Right lower leg: No edema.  ?   Left lower leg: No edema.  ?Lymphadenopathy:  ?   Cervical: No cervical adenopathy.  ?Skin: ?   General: Skin is warm and dry.  ?   Capillary Refill: Capillary refill takes less than 2 seconds.  ?   Coloration: Skin is not jaundiced or pale.  ?   Findings: No bruising, erythema, lesion or rash.  ?Neurological:  ?   General: No focal deficit present.  ?   Mental Status: She is alert and oriented to person, place, and time. Mental status is at baseline.  ?   Cranial Nerves: No cranial nerve deficit.  ?   Sensory: No sensory deficit.  ?   Motor: No weakness.  ?   Coordination: Coordination normal.  ?   Gait: Gait normal.  ?   Deep Tendon Reflexes: Reflexes normal.  ?Psychiatric:     ?   Mood and Affect: Mood normal.      ?   Behavior: Behavior normal.     ?   Thought Content: Thought content normal.     ?   Judgment: Judgment normal.  ? ? ?Results for orders placed or performed in visit on 09/13/21  ?Microscopic Examination  ? Glenford Peers

## 2021-09-14 ENCOUNTER — Other Ambulatory Visit: Payer: Self-pay | Admitting: Family Medicine

## 2021-09-14 LAB — COMPREHENSIVE METABOLIC PANEL
ALT: 17 IU/L (ref 0–32)
AST: 19 IU/L (ref 0–40)
Albumin/Globulin Ratio: 2 (ref 1.2–2.2)
Albumin: 4.5 g/dL (ref 3.8–4.8)
Alkaline Phosphatase: 81 IU/L (ref 44–121)
BUN/Creatinine Ratio: 10 (ref 9–23)
BUN: 8 mg/dL (ref 6–24)
Bilirubin Total: 0.3 mg/dL (ref 0.0–1.2)
CO2: 20 mmol/L (ref 20–29)
Calcium: 9.4 mg/dL (ref 8.7–10.2)
Chloride: 107 mmol/L — ABNORMAL HIGH (ref 96–106)
Creatinine, Ser: 0.77 mg/dL (ref 0.57–1.00)
Globulin, Total: 2.2 g/dL (ref 1.5–4.5)
Glucose: 88 mg/dL (ref 70–99)
Potassium: 4.2 mmol/L (ref 3.5–5.2)
Sodium: 141 mmol/L (ref 134–144)
Total Protein: 6.7 g/dL (ref 6.0–8.5)
eGFR: 99 mL/min/{1.73_m2} (ref >59)

## 2021-09-14 LAB — CBC WITH DIFFERENTIAL/PLATELET
Basophils Absolute: 0.1 10*3/uL (ref 0.0–0.2)
Basos: 1 %
EOS (ABSOLUTE): 0.1 10*3/uL (ref 0.0–0.4)
Eos: 2 %
Hematocrit: 41 % (ref 34.0–46.6)
Hemoglobin: 13.7 g/dL (ref 11.1–15.9)
Immature Grans (Abs): 0.1 10*3/uL (ref 0.0–0.1)
Immature Granulocytes: 1 %
Lymphocytes Absolute: 3.1 10*3/uL (ref 0.7–3.1)
Lymphs: 32 %
MCH: 30.4 pg (ref 26.6–33.0)
MCHC: 33.4 g/dL (ref 31.5–35.7)
MCV: 91 fL (ref 79–97)
Monocytes Absolute: 0.5 10*3/uL (ref 0.1–0.9)
Monocytes: 5 %
Neutrophils Absolute: 5.7 10*3/uL (ref 1.4–7.0)
Neutrophils: 59 %
Platelets: 255 10*3/uL (ref 150–450)
RBC: 4.51 x10E6/uL (ref 3.77–5.28)
RDW: 11.8 % (ref 11.7–15.4)
WBC: 9.5 10*3/uL (ref 3.4–10.8)

## 2021-09-14 LAB — PROLACTIN: Prolactin: 22.9 ng/mL (ref 4.8–23.3)

## 2021-09-14 LAB — LIPID PANEL W/O CHOL/HDL RATIO
Cholesterol, Total: 164 mg/dL (ref 100–199)
HDL: 36 mg/dL — ABNORMAL LOW (ref >39)
LDL Chol Calc (NIH): 89 mg/dL (ref 0–99)
Triglycerides: 233 mg/dL — ABNORMAL HIGH (ref 0–149)
VLDL Cholesterol Cal: 39 mg/dL (ref 5–40)

## 2021-09-14 LAB — TSH: TSH: 3.17 u[IU]/mL (ref 0.450–4.500)

## 2021-09-14 LAB — VITAMIN D 25 HYDROXY (VIT D DEFICIENCY, FRACTURES): Vit D, 25-Hydroxy: 11.8 ng/mL — ABNORMAL LOW (ref 30.0–100.0)

## 2021-09-14 MED ORDER — VITAMIN D (ERGOCALCIFEROL) 1.25 MG (50000 UNIT) PO CAPS: 50000.0000 [IU] | ORAL_CAPSULE | ORAL | 1 refills | Status: DC

## 2021-09-15 ENCOUNTER — Other Ambulatory Visit: Payer: Self-pay | Admitting: Family Medicine

## 2021-09-15 NOTE — Telephone Encounter (Signed)
Requested Prescriptions  ?Pending Prescriptions Disp Refills  ?? cetirizine (ZYRTEC) 10 MG tablet [Pharmacy Med Name: CETIRIZINE 10MG  TABLETS] 90 tablet 3  ?  Sig: TAKE 1 TABLET(10 MG) BY MOUTH DAILY  ?  ? Ear, Nose, and Throat:  Antihistamines 2 Passed - 09/14/2021 12:26 PM  ?  ?  Passed - Cr in normal range and within 360 days  ?  Creatinine  ?Date Value Ref Range Status  ?01/27/2014 0.96 0.60 - 1.30 mg/dL Final  ? ?Creatinine, Ser  ?Date Value Ref Range Status  ?09/13/2021 0.77 0.57 - 1.00 mg/dL Final  ?   ?  ?  Passed - Valid encounter within last 12 months  ?  Recent Outpatient Visits   ?      ? 2 days ago Routine general medical examination at a health care facility  ? [facility] [place], Megan P, DO  ? 4 months ago Adjustment disorder with mixed anxiety and depressed mood  ? [facility] [place], Megan P, DO  ? 6 months ago Migraine without aura and with status migrainosus, not intractable  ? [facility] [place], Megan P, DO  ? 11 months ago Cellulitis of left lower extremity  ? [facility] [place], [place], DO  ? 1 year ago Routine general medical examination at a health care facility  ? [facility] [place], [place] P, DO  ?  ?  ?Future Appointments   ?        ? In 1 year Johnson, Megan P, DO [facility]  ?  ? ?  ?  ?  ?? montelukast (SINGULAIR) 10 MG tablet [Pharmacy Med Name: MONTELUKAST 10MG  TABLETS] 90 tablet 3  ?  Sig: TAKE 1 TABLET(10 MG) BY MOUTH AT BEDTIME  ?  ? Pulmonology:  Leukotriene Inhibitors Passed - 09/14/2021 12:26 PM  ?  ?  Passed - Valid encounter within last 12 months  ?  Recent Outpatient Visits   ?      ? 2 days ago Routine general medical examination at a health care facility  ? [facility] [place], Megan P, DO  ? 4 months ago Adjustment disorder with mixed anxiety and depressed mood  ? [facility] [place], Megan P, DO  ? 6 months ago Migraine without aura and with status migrainosus,  not intractable  ? [facility] [place], Megan P, DO  ? 11 months ago Cellulitis of left lower extremity  ? [facility] [place], [facility] P, DO  ? 1 year ago Routine general medical examination at a health care facility  ? [facility] [place], [facility] P, DO  ?  ?  ?Future Appointments   ?        ? In 1 year Johnson, Megan P, DO [facility]  ?  ? ?  ?  ?  ?? ondansetron (ZOFRAN-ODT) 4 MG disintegrating tablet [Pharmacy Med Name: ONDANSETRON ODT 4MG  TABLETS] 45 tablet   ?  Sig: DISSOLVE 1 TABLET(4 MG) ON THE TONGUE EVERY 8 HOURS AS NEEDED FOR NAUSEA OR VOMITING  ?  ? Not Delegated - Gastroenterology: Antiemetics - ondansetron Failed - 09/14/2021 12:26 PM  ?  ?  Failed - This refill cannot be delegated  ?  ?  Passed - AST in normal range and within 360 days  ?  AST  ?Date Value Ref Range Status  ?09/13/2021 19 0 - 40 IU/L Final  ? ?SGOT(AST)  ?Date Value Ref  Range Status  ?01/27/2014 19 15 - 37 Unit/L Final  ?   ?  ?  Passed - ALT in normal range and within 360 days  ?  ALT  ?Date Value Ref Range Status  ?09/13/2021 17 0 - 32 IU/L Final  ? ?SGPT (ALT)  ?Date Value Ref Range Status  ?01/27/2014 22 U/L Final  ?  Comment:  ?  14-63 ?NOTE: New Reference Range ?12/31/13 ?  ?   ?  ?  Passed - Valid encounter within last 6 months  ?  Recent Outpatient Visits   ?      ? 2 days ago Routine general medical examination at a health care facility  ? [facility] [place], Megan P, DO  ? 4 months ago Adjustment disorder with mixed anxiety and depressed mood  ? [facility] [place], Megan P, DO  ? 6 months ago Migraine without aura and with status migrainosus, not intractable  ? [facility] [place], Megan P, DO  ? 11 months ago Cellulitis of left lower extremity  ? [facility], [place] P, DO  ? 1 year ago Routine general medical examination at a health care facility  ? [facility] [place], [place] P, DO  ?  ?  ?Future  Appointments   ?        ? In 1 year Laural Benes, Oralia Rud, DO [facility]  ?  ? ?  ?  ?  ? ? ?

## 2021-09-15 NOTE — Telephone Encounter (Signed)
Requested medication (s) are due for refill today: yes ? ?Requested medication (s) are on the active medication list: yes ? ?Last refill:  yes ? ?Future visit scheduled: yes ? ?Notes to clinic:  med not delegated to NT to RF ? ? ?Requested Prescriptions  ?Pending Prescriptions Disp Refills  ? ondansetron (ZOFRAN-ODT) 4 MG disintegrating tablet [Pharmacy Med Name: ONDANSETRON ODT 4MG  TABLETS] 45 tablet   ?  Sig: DISSOLVE 1 TABLET(4 MG) ON THE TONGUE EVERY 8 HOURS AS NEEDED FOR NAUSEA OR VOMITING  ?  ? Not Delegated - Gastroenterology: Antiemetics - ondansetron Failed - 09/14/2021 12:26 PM  ?  ?  Failed - This refill cannot be delegated  ?  ?  Passed - AST in normal range and within 360 days  ?  AST  ?Date Value Ref Range Status  ?09/13/2021 19 0 - 40 IU/L Final  ? ?SGOT(AST)  ?Date Value Ref Range Status  ?01/27/2014 19 15 - 37 Unit/L Final  ?  ?  ?  ?  Passed - ALT in normal range and within 360 days  ?  ALT  ?Date Value Ref Range Status  ?09/13/2021 17 0 - 32 IU/L Final  ? ?SGPT (ALT)  ?Date Value Ref Range Status  ?01/27/2014 22 U/L Final  ?  Comment:  ?  14-63 ?NOTE: New Reference Range ?12/31/13 ?  ?  ?  ?  ?  Passed - Valid encounter within last 6 months  ?  Recent Outpatient Visits   ? ?      ? 2 days ago Routine general medical examination at a health care facility  ? [facility] [place], Megan P, DO  ? 4 months ago Adjustment disorder with mixed anxiety and depressed mood  ? [facility] [place], Megan P, DO  ? 6 months ago Migraine without aura and with status migrainosus, not intractable  ? [facility] [place], Megan P, DO  ? 11 months ago Cellulitis of left lower extremity  ? [facility] [place], [place] P, DO  ? 1 year ago Routine general medical examination at a health care facility  ? [facility] [place], [place] P, DO  ? ?  ?  ?Future Appointments   ? ?        ? In 1 year [place], Laural Benes, DO [facility]  ? ?  ? ?  ?  ?   ?Signed Prescriptions Disp Refills  ? cetirizine (ZYRTEC) 10 MG tablet 90 tablet 3  ?  Sig: TAKE 1 TABLET(10 MG) BY MOUTH DAILY  ?  ? Ear, Nose, and Throat:  Antihistamines 2 Passed - 09/14/2021 12:26 PM  ?  ?  Passed - Cr in normal range and within 360 days  ?  Creatinine  ?Date Value Ref Range Status  ?01/27/2014 0.96 0.60 - 1.30 mg/dL Final  ? ?Creatinine, Ser  ?Date Value Ref Range Status  ?09/13/2021 0.77 0.57 - 1.00 mg/dL Final  ?  ?  ?  ?  Passed - Valid encounter within last 12 months  ?  Recent Outpatient Visits   ? ?      ? 2 days ago Routine general medical examination at a health care facility  ? [facility] [place], Megan P, DO  ? 4 months ago Adjustment disorder with mixed anxiety and depressed mood  ? [facility] [place], Megan P, DO  ? 6 months ago Migraine without aura and with status migrainosus, not intractable  ?  [facility] [place], Megan P, DO  ? 11 months ago Cellulitis of left lower extremity  ? [facility] [place], [place] P, DO  ? 1 year ago Routine general medical examination at a health care facility  ? [facility] [place], [place] P, DO  ? ?  ?  ?Future Appointments   ? ?        ? In 1 year [place], Megan P, DO [facility]  ? ?  ? ?  ?  ?  ? montelukast (SINGULAIR) 10 MG tablet 90 tablet 3  ?  Sig: TAKE 1 TABLET(10 MG) BY MOUTH AT BEDTIME  ?  ? Pulmonology:  Leukotriene Inhibitors Passed - 09/14/2021 12:26 PM  ?  ?  Passed - Valid encounter within last 12 months  ?  Recent Outpatient Visits   ? ?      ? 2 days ago Routine general medical examination at a health care facility  ? [facility] [place], Megan P, DO  ? 4 months ago Adjustment disorder with mixed anxiety and depressed mood  ? [facility] [place], Megan P, DO  ? 6 months ago Migraine without aura and with status migrainosus, not intractable  ? [facility] [place], Megan P, DO  ? 11 months ago Cellulitis of left  lower extremity  ? [facility] [place], [place] P, DO  ? 1 year ago Routine general medical examination at a health care facility  ? [facility] [place], [place] P, DO  ? ?  ?  ?Future Appointments   ? ?        ? In 1 year Laural Benes, Oralia Rud, DO [facility]  ? ?  ? ?  ?  ?  ? ? ? ? ?

## 2021-09-16 NOTE — Telephone Encounter (Signed)
Requested medication (s) are due for refill today - expired Rx ? ?Requested medication (s) are on the active medication list -yes ? ?Future visit scheduled -yes ? ?Last refill: 08/07/20 #30 11RF ? ?Notes to clinic: Request RF: expired Rx  ? ?Requested Prescriptions  ?Pending Prescriptions Disp Refills  ? Lactobacillus (PROBIOTIC CHILDRENS) CHEW [Pharmacy Med Name: PROBIOTIC CHILDREN CHEWABLES] 30 tablet   ?  Sig: CHEW AND SWALLOW 1 TABLET BY MOUTH DAILY  ?  ? Endocrinology:  Nutritional Agents Passed - 09/15/2021  6:01 PM  ?  ?  Passed - Valid encounter within last 12 months  ?  Recent Outpatient Visits   ? ?      ? 3 days ago Routine general medical examination at a health care facility  ? [facility] [place], Megan P, DO  ? 4 months ago Adjustment disorder with mixed anxiety and depressed mood  ? [facility] [place] [facility] [place], Megan P, DO  ? 6 months ago Migraine without aura and with status migrainosus, not intractable  ? [facility] [place], Megan P, DO  ? 11 months ago Cellulitis of left lower extremity  ? [facility] [place], [place] P, DO  ? 1 year ago Routine general medical examination at a health care facility  ? [facility] [place], [place] P, DO  ? ?  ?  ?Future Appointments   ? ?        ? In 1 year Laural Benes, Oralia Rud, DO [facility]  ? ?  ? ?  ?  ?  ? ? ? ?Requested Prescriptions  ?Pending Prescriptions Disp Refills  ? Lactobacillus (PROBIOTIC CHILDRENS) CHEW [Pharmacy Med Name: PROBIOTIC CHILDREN CHEWABLES] 30 tablet   ?  Sig: CHEW AND SWALLOW 1 TABLET BY MOUTH DAILY  ?  ? Endocrinology:  Nutritional Agents Passed - 09/15/2021  6:01 PM  ?  ?  Passed - Valid encounter within last 12 months  ?  Recent Outpatient Visits   ? ?      ? 3 days ago Routine general medical examination at a health care facility  ? [facility] [place], Megan P, DO  ? 4 months ago Adjustment disorder with mixed anxiety and depressed mood  ? [facility] [place], Megan P, DO  ? 6 months ago Migraine without aura and with status migrainosus, not intractable  ? [facility] [place], Megan P, DO  ? 11 months ago Cellulitis of left lower extremity  ? [facility] [place], [place] P, DO  ? 1 year ago Routine general medical examination at a health care facility  ? [facility] [place], [place] P, DO  ? ?  ?  ?Future Appointments   ? ?        ? In 1 year Laural Benes, Oralia Rud, DO [facility]  ? ?  ? ?  ?  ?  ? ? ? ?

## 2021-12-30 ENCOUNTER — Encounter: Payer: Self-pay | Admitting: Family Medicine

## 2022-03-05 ENCOUNTER — Other Ambulatory Visit: Payer: Self-pay | Admitting: Family Medicine

## 2022-03-07 NOTE — Telephone Encounter (Signed)
Requested medications are due for refill today.  yes  Requested medications are on the active medications list.  yes  Last refill. 09/14/2021 #12 1 rf  Future visit scheduled.   yes  Notes to clinic.  Refill not delegated    Requested Prescriptions  Pending Prescriptions Disp Refills   Vitamin D, Ergocalciferol, (DRISDOL) 1.25 MG (50000 UNIT) CAPS capsule [Pharmacy Med Name: VITAMIN D2 50,000IU (ERGO) CAP RX] 12 capsule 1    Sig: TAKE 1 CAPSULE BY MOUTH EVERY 7 DAYS     Endocrinology:  Vitamins - Vitamin D Supplementation 2 Failed - 03/05/2022 11:53 AM      Failed - Manual Review: Route requests for 50,000 IU strength to the provider      Failed - Vitamin D in normal range and within 360 days    Vit D, 25-Hydroxy  Date Value Ref Range Status  09/13/2021 11.8 (L) 30.0 - 100.0 ng/mL Final    Comment:    Vitamin D deficiency has been defined by the Institute of Medicine and an Endocrine Society practice guideline as a level of serum 25-OH vitamin D less than 20 ng/mL (1,2). The Endocrine Society went on to further define vitamin D insufficiency as a level between 21 and 29 ng/mL (2). 1. IOM (Institute of Medicine). 2010. Dietary reference    intakes for calcium and D. [place]: The    [employer]. 2. Holick MF, Binkley Babbitt, Bischoff-Ferrari HA, et al.    Evaluation, treatment, and prevention of vitamin D    deficiency: an Endocrine Society clinical practice    guideline. JCEM. 2011 Jul; 96(7):1911-30.          Passed - Ca in normal range and within 360 days    Calcium  Date Value Ref Range Status  09/13/2021 9.4 8.7 - 10.2 mg/dL Final   Calcium, Total  Date Value Ref Range Status  01/27/2014 8.8 8.5 - 10.1 mg/dL Final         Passed - Valid encounter within last 12 months    Recent Outpatient Visits           5 months ago Routine general medical examination at a health care facility   [facility], [place] P, DO   10 months ago  Adjustment disorder with mixed anxiety and depressed mood   [facility], Megan P, DO   11 months ago Migraine without aura and with status migrainosus, not intractable   [place], Megan P, DO   1 year ago Cellulitis of left lower extremity   [place], Megan P, DO   1 year ago Routine general medical examination at a health care facility   [place], Barb Merino, DO       Future Appointments             In 6 months Wynetta Emery, Barb Merino, DO [facility]

## 2022-04-08 ENCOUNTER — Encounter: Payer: Self-pay | Admitting: Physician Assistant

## 2022-04-08 ENCOUNTER — Ambulatory Visit: Payer: Managed Care, Other (non HMO) | Admitting: Physician Assistant

## 2022-04-08 VITALS — BP 111/78 | HR 82 | Temp 99.2°F | Wt 237.6 lb

## 2022-04-08 DIAGNOSIS — J0100 Acute maxillary sinusitis, unspecified: Secondary | ICD-10-CM | POA: Diagnosis not present

## 2022-04-08 DIAGNOSIS — R0602 Shortness of breath: Secondary | ICD-10-CM | POA: Diagnosis not present

## 2022-04-08 MED ORDER — ALBUTEROL SULFATE HFA 108 (90 BASE) MCG/ACT IN AERS
2.0000 | INHALATION_SPRAY | Freq: Four times a day (QID) | RESPIRATORY_TRACT | 0 refills | Status: DC | PRN
Start: 2022-04-08 — End: 2022-05-02

## 2022-04-08 MED ORDER — DOXYCYCLINE HYCLATE 100 MG PO TABS: 100.0000 mg | ORAL_TABLET | Freq: Two times a day (BID) | ORAL | 0 refills | Status: AC

## 2022-04-08 NOTE — Progress Notes (Unsigned)
Established Patient Office Visit  Name: Katrina Long   MRN: [id number]    DOB: [birth year]   Date:04/08/2022  Today's Provider: Talitha Givens, MHS, PA-C Introduced myself to the patient as a PA-C and provided education on APPs in clinical practice.         Subjective  Chief Complaint  Chief Complaint  Patient presents with   URI    Pt states she has been having a cough, congestion, chills, sinus pressure, chest congestion, and drainage for the last week. States she has been coughing up thick phlegm.     URI  Associated symptoms include congestion, coughing, ear pain, headaches and sinus pain. Pertinent negatives include no chest pain, diarrhea, nausea, sore throat or vomiting.     URI -type symptoms Sinus congestion, sinus pressure, chills, productive cough, postnasal drainage for one week  Feels like it is getting worse over the last few days   Onset: gradual  Duration: one week  Fever/chills: yes- goes between feeling hot to chills  Sore throat: yes about 2 days ago  Nausea:no  Vomiting:no Diarrhea: no Chest pain: coughing causes some sternal chest pain  SOB: yes  Productive cough: yes  Headaches: yes  Dizziness: no Ear pain: about 2 days ago   Alleviating: Robitussin helped with sleep  Aggravating: cold air  Intervention: She has tried Alkaseltzer Plus, Robitussin nighttime  COVID testing at home: tested on Monday   Result: negative   Sick contacts: no    Patient Active Problem List   Diagnosis Date Noted   Hyperprolactinemia ([place]) 09/13/2021   Vitamin D deficiency 09/30/2020   Adjustment disorder with mixed anxiety and depressed mood 07/23/2019   Allergic rhinitis 05/14/2017   Environmental allergies 11/10/2015   Contraceptive surveillance 05/13/2015    History reviewed. No pertinent surgical history.  Family History  Problem Relation Age of Onset   Mental illness Mother    Lung disease Mother    Diabetes Sister    Cancer Daughter    Breast  cancer Maternal Aunt    Cancer Maternal Aunt 40       Breast   Hypertension Maternal Grandmother    Mental illness Brother     Social History   Tobacco Use   Smoking status: Never   Smokeless tobacco: Never  Substance Use Topics   Alcohol use: No     Current Outpatient Medications:    cetirizine (ZYRTEC) 10 MG tablet, TAKE 1 TABLET(10 MG) BY MOUTH DAILY, Disp: 90 tablet, Rfl: 3   Cranberry 300 MG tablet, Take 1 tablet (300 mg total) by mouth 2 (two) times daily., Disp: 60 tablet, Rfl: 11   ketotifen (ZADITOR) 0.025 % ophthalmic solution, INSTILL 1 DROP INTO BOTH EYES EVERY DAY, Disp: 5 mL, Rfl: 11   montelukast (SINGULAIR) 10 MG tablet, TAKE 1 TABLET(10 MG) BY MOUTH AT BEDTIME, Disp: 90 tablet, Rfl: 3   ondansetron (ZOFRAN-ODT) 4 MG disintegrating tablet, DISSOLVE 1 TABLET(4 MG) ON THE TONGUE EVERY 8 HOURS AS NEEDED FOR NAUSEA OR VOMITING, Disp: 45 tablet, Rfl: 0   PREVIDENT 0.2 % SOLN, Take by mouth once a week., Disp: , Rfl:    Probiotic Product (PROBIOTIC-10) CHEW, Chew 1 Dose by mouth daily., Disp: 30 tablet, Rfl: 11   SODIUM FLUORIDE 5000 PLUS 1.1 % CREA dental cream, at bedtime., Disp: , Rfl:    SUMAtriptan (IMITREX) 100 MG tablet, Take 0.5-1 tablets (50-100 mg total) by mouth every 2 (two) hours as  needed for migraine. May repeat in 2 hours if headache persists or recurs., Disp: 9 tablet, Rfl: 12   triamcinolone (NASACORT ALLERGY 24HR) 55 MCG/ACT AERO nasal inhaler, Place 2 sprays into the nose daily., Disp: 3 Inhaler, Rfl: 4   Vitamin D, Ergocalciferol, (DRISDOL) 1.25 MG (50000 UNIT) CAPS capsule, TAKE 1 CAPSULE BY MOUTH EVERY 7 DAYS, Disp: 12 capsule, Rfl: 1  Current Facility-Administered Medications:    medroxyPROGESTERone (DEPO-PROVERA) injection 150 mg, 150 mg, Intramuscular, Q90 days, Volney American, PA-C, 150 mg at 04/09/21 1550  Allergies  Allergen Reactions   Amoxicillin Rash   Entex Lq [Phenylephrine-Guaifenesin] Rash   Penicillins Rash    I personally  reviewed active problem list, medication list, allergies, lab results with the patient/caregiver today.   Review of Systems  Constitutional:  Positive for chills and fever.  HENT:  Positive for congestion, ear pain and sinus pain. Negative for sore throat.   Respiratory:  Positive for cough, sputum production and shortness of breath.   Cardiovascular:  Negative for chest pain.  Gastrointestinal:  Negative for diarrhea, nausea and vomiting.  Musculoskeletal:  Negative for myalgias.  Neurological:  Positive for headaches. Negative for dizziness.      Objective  Vitals:   04/08/22 1038  BP: 111/78  Pulse: 82  Temp: 99.2 F (37.3 C)  TempSrc: Oral  SpO2: 99%  Weight: 237 lb 9.6 oz (107.8 kg)    Body mass index is 38.47 kg/m.  Physical Exam Constitutional:      General: She is awake.     Appearance: Normal appearance. She is well-developed and well-groomed.  HENT:     Head: Normocephalic and atraumatic.     Right Ear: Ear canal and external ear normal. A middle ear effusion is present. Tympanic membrane is bulging. Tympanic membrane is not injected, perforated, erythematous or retracted.     Left Ear: Ear canal and external ear normal. A middle ear effusion is present. Tympanic membrane is not injected, perforated, erythematous, retracted or bulging.     Mouth/Throat:     Mouth: Mucous membranes are moist.     Pharynx: Oropharynx is clear. Uvula midline. Posterior oropharyngeal erythema present. No oropharyngeal exudate.  Cardiovascular:     Rate and Rhythm: Normal rate and regular rhythm.     Pulses: Normal pulses.     Heart sounds: Normal heart sounds. No murmur heard.    No friction rub. No gallop.  Pulmonary:     Effort: Pulmonary effort is normal.     Breath sounds: Normal breath sounds. No decreased air movement. No decreased breath sounds, wheezing, rhonchi or rales.  Neurological:     Mental Status: She is alert.  Psychiatric:        Behavior: Behavior is  cooperative.      No results found for this or any previous visit (from the past 2160 hour(s)).   PHQ2/9:    04/08/2022   10:50 AM 09/13/2021    4:07 PM 08/07/2020    3:04 PM 08/19/2019    9:31 AM 07/22/2019    3:39 PM  Depression screen PHQ 2/9  Decreased Interest 1 1 0 2 3  Down, Depressed, Hopeless 0 1 0 0 0  PHQ - 2 Score 1 2 0 2 3  Altered sleeping 1 0  0 0  Tired, decreased energy 1 1  3 3   Change in appetite 0 2  0 2  Feeling bad or failure about yourself  0 0  0 2  Trouble  concentrating 0 0  3 0  Moving slowly or fidgety/restless 0 0  1 0  Suicidal thoughts 0 0  0 0  PHQ-9 Score 3 5  9 10   Difficult doing work/chores Not difficult at all Somewhat difficult  Somewhat difficult Very difficult      Fall Risk:    04/08/2022   10:49 AM 09/13/2021    4:07 PM 07/22/2019    3:39 PM 05/29/2018    2:25 PM 05/29/2018    2:04 PM  [place] in the past year? 0 0 0 1 1  Number falls in past yr: 0 0 0 1 1  Injury with Fall? 0 0 0 0 1  Risk for fall due to : No Fall Risks    History of fall(s)  Follow up Falls evaluation completed    Falls evaluation completed      Functional Status Survey:      Assessment & Plan  Problem List Items Addressed This Visit   None Visit Diagnoses     Acute maxillary sinusitis, recurrence not specified    -  Primary Acute, new concern  Reports sinus congestion, productive cough, sinus pressure, and chills for the past week that seem to be progressing in severity PE is consistent with sinusitis  Will provide Doxycycline 100 mg PO BID x 7 days  She can continue with OTC medications for symptomatic management Follow up as needed for persistent or progressing symptoms     Relevant Medications   doxycycline (VIBRA-TABS) 100 MG tablet   SOB (shortness of breath)     Acute, new concern Likely secondary to acute sinusitis Will provide Albuterol inhaler to assist with symptoms Follow up as needed for progressing or persistent  symptoms    Relevant Medications   albuterol (VENTOLIN HFA) 108 (90 Base) MCG/ACT inhaler        No follow-ups on file.   I, Letita Prentiss E Emmersen Garraway, PA-C, have reviewed all documentation for this visit. The documentation on 04/08/22 for the exam, diagnosis, procedures, and orders are all accurate and complete.   Talitha Givens, MHS, PA-C [facility]

## 2022-04-08 NOTE — Patient Instructions (Signed)
Based on your symptoms and duration of illness, I believe you may have a bacterial sinus infection  These typically resolve with antibiotic therapy along with at-home comfort measures   Today I have sent in a prescription for Doxycycline 100 mg to be taken by mouth twice per day for 7 days  FINISH THE ENTIRE COURSE unless you develop an allergic reaction or are instructed to discontinue. Please take with food and plenty of water to help prevent GI upset   It can take a few days for the antibiotic to kick in so I recommend symptomatic relief with over the counter medication such as the following: Dayquil/ Nyquil Theraflu Alkaseltzer  Coricidin - if you have high blood pressure even if it is well managed with medications  These medications typically have Tylenol in them already so you can take Ibuprofen as needed for further pain/ discomfort and fever management/ do not need to supplement with more outside of those medications  Stay well hydrated with at least 75 oz of water per day to help with recovery  If you notice any of the following please let us know: increased fever not responding to Tylenol or Ibuprofen, swelling around your nose or eyes, difficulty seeing,   It was nice to meet you and I appreciate the opportunity to be involved in your care If you were satisfied with the care you received from me, I would greatly appreciate you saying so in the after-visit survey that is sent out following our visit.

## 2022-04-30 ENCOUNTER — Other Ambulatory Visit: Payer: Self-pay | Admitting: Physician Assistant

## 2022-04-30 DIAGNOSIS — R0602 Shortness of breath: Secondary | ICD-10-CM

## 2022-05-02 NOTE — Telephone Encounter (Signed)
Requested Prescriptions  Pending Prescriptions Disp Refills   albuterol (VENTOLIN HFA) 108 (90 Base) MCG/ACT inhaler [Pharmacy Med Name: ALBUTEROL HFA INH (200 PUFFS) 6.7GM] 8 g 0    Sig: INHALE 2 PUFFS INTO THE LUNGS EVERY 6 HOURS AS NEEDED FOR WHEEZING OR SHORTNESS OF BREATH     Pulmonology:  Beta Agonists 2 Passed - 04/30/2022  3:36 AM      Passed - Last BP in normal range    BP Readings from Last 1 Encounters:  04/08/22 111/78         Passed - Last Heart Rate in normal range    Pulse Readings from Last 1 Encounters:  04/08/22 82         Passed - Valid encounter within last 12 months    Recent Outpatient Visits           3 weeks ago Acute maxillary sinusitis, recurrence not specified   [facility] [place], Oswaldo Conroy, PA-C   7 months ago Routine general medical examination at a health care facility   [facility], [place] P, DO   1 year ago Adjustment disorder with mixed anxiety and depressed mood   [facility] [place], Megan P, DO   1 year ago Migraine without aura and with status migrainosus, not intractable   [facility] [place], Megan P, DO   1 year ago Cellulitis of left lower extremity   [facility] [place], Oralia Rud, DO       Future Appointments             In 4 months [place], Oralia Rud, DO [facility]

## 2022-05-11 ENCOUNTER — Other Ambulatory Visit: Payer: Self-pay | Admitting: Family Medicine

## 2022-05-12 NOTE — Telephone Encounter (Signed)
Requested medication (s) are due for refill today: Yes  Requested medication (s) are on the active medication list: Yes  Last refill:  09/16/21  Future visit scheduled: Yes  Notes to clinic:  See request.    Requested Prescriptions  Pending Prescriptions Disp Refills   ondansetron (ZOFRAN-ODT) 4 MG disintegrating tablet [Pharmacy Med Name: ONDANSETRON ODT 4MG  TABLETS] 45 tablet 0    Sig: DISSOLVE 1 TABLET(4 MG) ON THE TONGUE EVERY 8 HOURS AS NEEDED FOR NAUSEA OR VOMITING     Not Delegated - Gastroenterology: Antiemetics - ondansetron Failed - 05/11/2022  6:01 PM      Failed - This refill cannot be delegated      Passed - AST in normal range and within 360 days    AST  Date Value Ref Range Status  09/13/2021 19 0 - 40 IU/L Final   SGOT(AST)  Date Value Ref Range Status  01/27/2014 19 15 - 37 Unit/L Final         Passed - ALT in normal range and within 360 days    ALT  Date Value Ref Range Status  09/13/2021 17 0 - 32 IU/L Final   SGPT (ALT)  Date Value Ref Range Status  01/27/2014 22 U/L Final    Comment:    14-63 NOTE: New Reference Range 12/31/13          Passed - Valid encounter within last 6 months    Recent Outpatient Visits           1 month ago Acute maxillary sinusitis, recurrence not specified   [facility] Mecum, 01/02/14, PA-C   8 months ago Routine general medical examination at a health care facility   [facility] P, DO   1 year ago Adjustment disorder with mixed anxiety and depressed mood   [facility] [place], Megan P, DO   1 year ago Migraine without aura and with status migrainosus, not intractable   [facility] [place], Megan P, DO   1 year ago Cellulitis of left lower extremity   [facility] [place], DO       Future Appointments             In 4 months Johnson, Dorcas Carrow, DO Oralia Rud, [facility]

## 2022-05-19 ENCOUNTER — Other Ambulatory Visit: Payer: Self-pay | Admitting: Family Medicine

## 2022-05-19 ENCOUNTER — Encounter: Payer: Self-pay | Admitting: Family Medicine

## 2022-05-20 ENCOUNTER — Encounter: Payer: Self-pay | Admitting: Family Medicine

## 2022-05-21 ENCOUNTER — Other Ambulatory Visit: Payer: Self-pay | Admitting: Family Medicine

## 2022-05-21 MED ORDER — ONDANSETRON 4 MG PO TBDP: ORAL_TABLET | ORAL | 0 refills | Status: DC

## 2022-05-21 NOTE — Telephone Encounter (Signed)
appt

## 2022-05-23 NOTE — Telephone Encounter (Signed)
LVM asking patient to call back to schedule an appointment per Dr. Johnson 

## 2022-05-24 ENCOUNTER — Ambulatory Visit: Payer: Managed Care, Other (non HMO) | Admitting: Family Medicine

## 2022-05-24 ENCOUNTER — Ambulatory Visit: Payer: Self-pay

## 2022-05-24 ENCOUNTER — Encounter: Payer: Self-pay | Admitting: Family Medicine

## 2022-05-24 VITALS — BP 99/68 | HR 88 | Temp 98.9°F | Ht 65.9 in | Wt 237.6 lb

## 2022-05-24 DIAGNOSIS — R052 Subacute cough: Secondary | ICD-10-CM

## 2022-05-24 MED ORDER — PREDNISONE 50 MG PO TABS: 50.0000 mg | ORAL_TABLET | Freq: Every day | ORAL | 0 refills | Status: DC

## 2022-05-24 MED ORDER — TRIAMCINOLONE ACETONIDE 40 MG/ML IJ SUSP
40.0000 mg | Freq: Once | INTRAMUSCULAR | Status: AC
  Administered 2022-05-24: 40 mg via INTRAMUSCULAR

## 2022-05-24 MED ORDER — OMEPRAZOLE 20 MG PO CPDR: 20.0000 mg | DELAYED_RELEASE_CAPSULE | Freq: Every day | ORAL | 3 refills | Status: DC

## 2022-05-24 NOTE — Telephone Encounter (Signed)
    Chief Complaint: Continued cough Symptoms: Will cough so hard will get SOB or vomit. Frequency: October Pertinent Negatives: Patient denies wheezing Disposition: [] ED /[] Urgent Care (no appt availability in office) / [] Appointment(In office/virtual)/ []  [facility]/ [] Home Care/ [] Refused Recommended Disposition /[] [facility]/ [x]  Follow-up with PCP Additional Notes: Asking to be worked in with Dr. .  Answer Assessment - Initial Assessment Questions 1. ONSET: "When did the cough begin?"      October 2. SEVERITY: "How bad is the cough today?"      Severe 3. SPUTUM: "Describe the color of your sputum" (none, dry cough; clear, white, yellow, green)     Yellow 4. HEMOPTYSIS: "Are you coughing up any blood?" If so ask: "How much?" (flecks, streaks, tablespoons, etc.)     No 5. DIFFICULTY BREATHING: "Are you having difficulty breathing?" If Yes, ask: "How bad is it?" (e.g., mild, moderate, severe)    - MILD: No SOB at rest, mild SOB with walking, speaks normally in sentences, can lie down, no retractions, pulse < 100.    - MODERATE: SOB at rest, SOB with minimal exertion and prefers to sit, cannot lie down flat, speaks in phrases, mild retractions, audible wheezing, pulse 100-120.    - SEVERE: Very SOB at rest, speaks in single words, struggling to breathe, sitting hunched forward, retractions, pulse > 120      Mild 6. FEVER: "Do you have a fever?" If Yes, ask: "What is your temperature, how was it measured, and when did it start?"     Saturday - 100.3 7. CARDIAC HISTORY: "Do you have any history of heart disease?" (e.g., heart attack, congestive heart failure)      No 8. LUNG HISTORY: "Do you have any history of lung disease?"  (e.g., pulmonary embolus, asthma, emphysema)     No 9. PE RISK FACTORS: "Do you have a history of blood clots?" (or: recent major surgery, recent prolonged travel, bedridden)     No 10. OTHER SYMPTOMS: "Do you have any other  symptoms?" (e.g., runny nose, wheezing, chest pain)       Congestion  11. PREGNANCY: "Is there any chance you are pregnant?" "When was your last menstrual period?"       No 12. TRAVEL: "Have you traveled out of the country in the last month?" (e.g., travel history, exposures)       No  Protocols used: Cough - Acute Productive-A-AH

## 2022-05-24 NOTE — Telephone Encounter (Signed)
Pt scheduled today @ 3:20

## 2022-05-24 NOTE — Progress Notes (Signed)
 BP 99/68   Pulse 88   Temp 98.9 F (37.2 C) (Oral)   Ht 5' 5.9" (1.674 m)   Wt 237 lb 9.6 oz (107.8 kg)   SpO2 98%   BMI 38.47 kg/m    Subjective:    Patient ID: Katrina Long, female    DOB: 09/20/1980, 41 y.o.   MRN: [id number]  HPI: Katrina Long is a 41 y.o. female  Chief Complaint  Patient presents with   Cough    Patient says she has been had the cough for a little over a month. Patient says it is worse when she eats. Patient says the inhaler helps a little bit.    COUGH Duration: 1+ month Circumstances of initial development of cough: URI Cough severity: severe Cough description: hacking and dry Aggravating factors:  worse with eating Alleviating factors: nothing Status:  stable Treatments attempted: cold/sinus, mucinex, cough syrup, antibiotics, and albuterol Wheezing: no Shortness of breath: no Chest pain: no Chest tightness:no Nasal congestion: no Runny nose: no Postnasal drip: no Frequent throat clearing or swallowing: yes Hemoptysis: no Fevers: no Night sweats: no Weight loss: no Heartburn: no Recent foreign travel: no Tuberculosis contacts: no   Relevant past medical, surgical, family and social history reviewed and updated as indicated. Interim medical history since our last visit reviewed. Allergies and medications reviewed and updated.  Review of Systems  Constitutional: Negative.   HENT: Negative.    Respiratory:  Positive for cough and shortness of breath. Negative for apnea, choking, chest tightness, wheezing and stridor.   Cardiovascular: Negative.   Gastrointestinal: Negative.   Musculoskeletal: Negative.   Psychiatric/Behavioral: Negative.      Per HPI unless specifically indicated above     Objective:    BP 99/68   Pulse 88   Temp 98.9 F (37.2 C) (Oral)   Ht 5' 5.9" (1.674 m)   Wt 237 lb 9.6 oz (107.8 kg)   SpO2 98%   BMI 38.47 kg/m   Wt Readings from Last 3 Encounters:  05/24/22 237 lb 9.6 oz (107.8 kg)  04/08/22 237 lb  9.6 oz (107.8 kg)  09/13/21 230 lb 9.6 oz (104.6 kg)    Physical Exam Vitals and nursing note reviewed.  Constitutional:      General: She is not in acute distress.    Appearance: Normal appearance. She is obese. She is not ill-appearing, toxic-appearing or diaphoretic.  HENT:     Head: Normocephalic and atraumatic.     Right Ear: Tympanic membrane and external ear normal.     Left Ear: Tympanic membrane, ear canal and external ear normal.     Nose: Nose normal. No congestion or rhinorrhea.     Mouth/Throat:     Mouth: Mucous membranes are moist.     Pharynx: Oropharynx is clear. No oropharyngeal exudate or posterior oropharyngeal erythema.  Eyes:     General: No scleral icterus.       Right eye: No discharge.        Left eye: No discharge.     Extraocular Movements: Extraocular movements intact.     Conjunctiva/sclera: Conjunctivae normal.     Pupils: Pupils are equal, round, and reactive to light.  Cardiovascular:     Rate and Rhythm: Normal rate and regular rhythm.     Pulses: Normal pulses.     Heart sounds: Normal heart sounds. No murmur heard.    No friction rub. No gallop.  Pulmonary:     Effort: Pulmonary effort is   normal. No respiratory distress.     Breath sounds: Normal breath sounds. No stridor. No wheezing, rhonchi or rales.  Chest:     Chest wall: No tenderness.  Musculoskeletal:        General: Normal range of motion.     Cervical back: Normal range of motion and neck supple.  Skin:    General: Skin is warm and dry.     Capillary Refill: Capillary refill takes less than 2 seconds.     Coloration: Skin is not jaundiced or pale.     Findings: No bruising, erythema, lesion or rash.  Neurological:     General: No focal deficit present.     Mental Status: She is alert and oriented to person, place, and time. Mental status is at baseline.  Psychiatric:        Mood and Affect: Mood normal.        Behavior: Behavior normal.        Thought Content: Thought content  normal.        Judgment: Judgment normal.     Results for orders placed or performed in visit on 09/13/21  Microscopic Examination   Urine  Result Value Ref Range   WBC, UA 0-5 0 - 5 /hpf   RBC, Urine 0-2 0 - 2 /hpf   Epithelial Cells (non renal) 0-10 0 - 10 /hpf   Mucus, UA Present (A) Not Estab.   Bacteria, UA None seen None seen/Few  CBC with Differential/Platelet  Result Value Ref Range   WBC 9.5 3.4 - 10.8 x10E3/uL   RBC 4.51 3.77 - 5.28 x10E6/uL   Hemoglobin 13.7 11.1 - 15.9 g/dL   Hematocrit 41.0 34.0 - 46.6 %   MCV 91 79 - 97 fL   MCH 30.4 26.6 - 33.0 pg   MCHC 33.4 31.5 - 35.7 g/dL   RDW 11.8 11.7 - 15.4 %   Platelets 255 150 - 450 x10E3/uL   Neutrophils 59 Not Estab. %   Lymphs 32 Not Estab. %   Monocytes 5 Not Estab. %   Eos 2 Not Estab. %   Basos 1 Not Estab. %   Neutrophils Absolute 5.7 1.4 - 7.0 x10E3/uL   Lymphocytes Absolute 3.1 0.7 - 3.1 x10E3/uL   Monocytes Absolute 0.5 0.1 - 0.9 x10E3/uL   EOS (ABSOLUTE) 0.1 0.0 - 0.4 x10E3/uL   Basophils Absolute 0.1 0.0 - 0.2 x10E3/uL   Immature Granulocytes 1 Not Estab. %   Immature Grans (Abs) 0.1 0.0 - 0.1 x10E3/uL  Comprehensive metabolic panel  Result Value Ref Range   Glucose 88 70 - 99 mg/dL   BUN 8 6 - 24 mg/dL   Creatinine, Ser 0.77 0.57 - 1.00 mg/dL   eGFR 99 >59 mL/min/1.73   BUN/Creatinine Ratio 10 9 - 23   Sodium 141 134 - 144 mmol/L   Potassium 4.2 3.5 - 5.2 mmol/L   Chloride 107 (H) 96 - 106 mmol/L   CO2 20 20 - 29 mmol/L   Calcium 9.4 8.7 - 10.2 mg/dL   Total Protein 6.7 6.0 - 8.5 g/dL   Albumin 4.5 3.8 - 4.8 g/dL   Globulin, Total 2.2 1.5 - 4.5 g/dL   Albumin/Globulin Ratio 2.0 1.2 - 2.2   Bilirubin Total 0.3 0.0 - 1.2 mg/dL   Alkaline Phosphatase 81 44 - 121 IU/L   AST 19 0 - 40 IU/L   ALT 17 0 - 32 IU/L  Lipid Panel w/o Chol/HDL Ratio  Result Value Ref Range     Cholesterol, Total 164 100 - 199 mg/dL   Triglycerides 233 (H) 0 - 149 mg/dL   HDL 36 (L) >39 mg/dL   VLDL Cholesterol Cal  39 5 - 40 mg/dL   LDL Chol Calc (NIH) 89 0 - 99 mg/dL  Urinalysis, Routine w reflex microscopic  Result Value Ref Range   Specific Gravity, UA 1.025 1.005 - 1.030   pH, UA 7.0 5.0 - 7.5   Color, UA Yellow Yellow   Appearance Ur Cloudy (A) Clear   Leukocytes,UA 1+ (A) Negative   Protein,UA Trace (A) Negative/Trace   Glucose, UA Negative Negative   Ketones, UA Negative Negative   RBC, UA Negative Negative   Bilirubin, UA Negative Negative   Urobilinogen, Ur 1.0 0.2 - 1.0 mg/dL   Nitrite, UA Negative Negative   Microscopic Examination See below:   TSH  Result Value Ref Range   TSH 3.170 0.450 - 4.500 uIU/mL  VITAMIN D 25 Hydroxy (Vit-D Deficiency, Fractures)  Result Value Ref Range   Vit D, 25-Hydroxy 11.8 (L) 30.0 - 100.0 ng/mL  Bayer DCA Hb A1c Waived  Result Value Ref Range   HB A1C (BAYER DCA - WAIVED) 4.8 4.8 - 5.6 %  Prolactin  Result Value Ref Range   Prolactin 22.9 4.8 - 23.3 ng/mL      Assessment & Plan:   Problem List Items Addressed This Visit   None Visit Diagnoses     Subacute cough    -  Primary   6+ weeks. Will treat with prednisone. Given it's worse with eating and 1st thing in the AM, concern for LPR. Will start omeprazole and recheck 3 weeks.   Relevant Medications   triamcinolone acetonide (KENALOG-40) injection 40 mg (Start on 05/24/2022  4:00 PM)        Follow up plan: Return in about 3 weeks (around 06/14/2022).      

## 2022-06-02 ENCOUNTER — Other Ambulatory Visit: Payer: Self-pay | Admitting: Family Medicine

## 2022-06-02 DIAGNOSIS — R0602 Shortness of breath: Secondary | ICD-10-CM

## 2022-06-02 NOTE — Telephone Encounter (Signed)
Requested Prescriptions  Pending Prescriptions Disp Refills   albuterol (VENTOLIN HFA) 108 (90 Base) MCG/ACT inhaler [Pharmacy Med Name: ALBUTEROL HFA INH (200 PUFFS) 6.7GM] 6.7 g 0    Sig: INHALE 2 PUFFS INTO THE LUNGS EVERY 6 HOURS AS NEEDED FOR WHEEZING OR SHORTNESS OF BREATH     Pulmonology:  [facility] 2 Passed - 06/02/2022  3:37 AM      Passed - Last BP in normal range    BP Readings from Last 1 Encounters:  05/24/22 99/68         Passed - Last Heart Rate in normal range    Pulse Readings from Last 1 Encounters:  05/24/22 88         Passed - Valid encounter within last 12 months    Recent Outpatient Visits           1 week ago Subacute cough   [facility], Megan P, DO   1 month ago Acute maxillary sinusitis, recurrence not specified   [facility], Oswaldo Conroy, PA-C   8 months ago Routine general medical examination at a health care facility   [facility], [place] P, DO   1 year ago Adjustment disorder with mixed anxiety and depressed mood   [facility], Megan P, DO   1 year ago Migraine without aura and with status migrainosus, not intractable   [facility] [place], Oralia Rud, DO       Future Appointments             In 2 weeks Laural Benes, Oralia Rud, DO [facility]   In 3 months Laural Benes, Oralia Rud, DO [facility]

## 2022-06-16 ENCOUNTER — Ambulatory Visit: Payer: Managed Care, Other (non HMO) | Admitting: Family Medicine

## 2022-06-16 ENCOUNTER — Encounter: Payer: Self-pay | Admitting: Family Medicine

## 2022-06-16 VITALS — BP 106/69 | HR 79 | Temp 98.5°F | Ht 65.9 in | Wt 237.9 lb

## 2022-06-16 DIAGNOSIS — R052 Subacute cough: Secondary | ICD-10-CM | POA: Diagnosis not present

## 2022-06-16 DIAGNOSIS — Z3042 Encounter for surveillance of injectable contraceptive: Secondary | ICD-10-CM

## 2022-06-16 MED ORDER — MEDROXYPROGESTERONE ACETATE 150 MG/ML IM SUSP
150.0000 mg | INTRAMUSCULAR | Status: AC
  Administered 2022-06-16 – 2023-05-19 (×3): 150 mg via INTRAMUSCULAR

## 2022-06-16 NOTE — Addendum Note (Signed)
Addended by: Valerie Roys on: 06/16/2022 04:46 PM   Modules accepted: Orders

## 2022-06-16 NOTE — Progress Notes (Addendum)
BP 106/69   Pulse 79   Temp 98.5 F (36.9 C) (Oral)   Ht 5' 5.9" (1.674 m)   Wt 237 lb 14.4 oz (107.9 kg)   SpO2 100%   BMI 38.51 kg/m    Subjective:    Patient ID: Katrina Long, female    DOB: March 29, 1981, 42 y.o.   MRN: [id number]  HPI: Katrina Long is a 42 y.o. female  Chief Complaint  Patient presents with   Cough   COUGH Duration: 7+ months Circumstances of initial development of cough: URI Cough severity:  resolved Cough description: none Aggravating factors:  worse at night Alleviating factors: prednisone, omeprazole Status:  better Treatments attempted: prednisone, omeprazole Wheezing: no Shortness of breath: no Chest pain: no Chest tightness:no Nasal congestion: no Runny nose: no Postnasal drip: no Frequent throat clearing or swallowing: no Hemoptysis: no Fevers: no Night sweats: no Weight loss: no Heartburn: no Recent foreign travel: no Tuberculosis contacts: no  CONTRACEPTION CONCERNS Contraception: abstinence Previous contraception: depo Sexual activity: not currently Gravida/Para: G2P2  Relevant past medical, surgical, family and social history reviewed and updated as indicated. Interim medical history since our last visit reviewed. Allergies and medications reviewed and updated.  Review of Systems  Constitutional: Negative.   Respiratory:  Positive for cough. Negative for apnea, choking, chest tightness, shortness of breath, wheezing and stridor.   Cardiovascular: Negative.   Gastrointestinal: Negative.   Musculoskeletal: Negative.   Psychiatric/Behavioral:  Positive for dysphoric mood. Negative for agitation, behavioral problems, confusion, decreased concentration, hallucinations, self-injury, sleep disturbance and suicidal ideas. The patient is nervous/anxious. The patient is not hyperactive.     Per HPI unless specifically indicated above     Objective:    BP 106/69   Pulse 79   Temp 98.5 F (36.9 C) (Oral)   Ht 5' 5.9" (1.674 m)    Wt 237 lb 14.4 oz (107.9 kg)   SpO2 100%   BMI 38.51 kg/m   Wt Readings from Last 3 Encounters:  06/16/22 237 lb 14.4 oz (107.9 kg)  05/24/22 237 lb 9.6 oz (107.8 kg)  04/08/22 237 lb 9.6 oz (107.8 kg)    Physical Exam Vitals and nursing note reviewed.  Constitutional:      General: She is not in acute distress.    Appearance: Normal appearance. She is obese. She is not ill-appearing, toxic-appearing or diaphoretic.  HENT:     Head: Normocephalic and atraumatic.     Right Ear: External ear normal.     Left Ear: External ear normal.     Nose: Nose normal.     Mouth/Throat:     Mouth: Mucous membranes are moist.     Pharynx: Oropharynx is clear.  Eyes:     General: No scleral icterus.       Right eye: No discharge.        Left eye: No discharge.     Extraocular Movements: Extraocular movements intact.     Conjunctiva/sclera: Conjunctivae normal.     Pupils: Pupils are equal, round, and reactive to light.  Cardiovascular:     Rate and Rhythm: Normal rate and regular rhythm.     Pulses: Normal pulses.     Heart sounds: Normal heart sounds. No murmur heard.    No friction rub. No gallop.  Pulmonary:     Effort: Pulmonary effort is normal. No respiratory distress.     Breath sounds: Normal breath sounds. No stridor. No wheezing, rhonchi or rales.  Chest:  Chest wall: No tenderness.  Musculoskeletal:        General: Normal range of motion.     Cervical back: Normal range of motion and neck supple.  Skin:    General: Skin is warm and dry.     Capillary Refill: Capillary refill takes less than 2 seconds.     Coloration: Skin is not jaundiced or pale.     Findings: No bruising, erythema, lesion or rash.  Neurological:     General: No focal deficit present.     Mental Status: She is alert and oriented to person, place, and time. Mental status is at baseline.  Psychiatric:        Mood and Affect: Mood normal.        Behavior: Behavior normal.        Thought Content:  Thought content normal.        Judgment: Judgment normal.     Results for orders placed or performed in visit on 09/13/21  Microscopic Examination   Urine  Result Value Ref Range   WBC, UA 0-5 0 - 5 /hpf   RBC, Urine 0-2 0 - 2 /hpf   Epithelial Cells (non renal) 0-10 0 - 10 /hpf   Mucus, UA Present (A) Not Estab.   Bacteria, UA None seen None seen/Few  CBC with Differential/Platelet  Result Value Ref Range   WBC 9.5 3.4 - 10.8 x10E3/uL   RBC 4.51 3.77 - 5.28 x10E6/uL   Hemoglobin 13.7 11.1 - 15.9 g/dL   Hematocrit 41.0 34.0 - 46.6 %   MCV 91 79 - 97 fL   MCH 30.4 26.6 - 33.0 pg   MCHC 33.4 31.5 - 35.7 g/dL   RDW 11.8 11.7 - 15.4 %   Platelets 255 150 - 450 x10E3/uL   Neutrophils 59 Not Estab. %   Lymphs 32 Not Estab. %   Monocytes 5 Not Estab. %   Eos 2 Not Estab. %   Basos 1 Not Estab. %   Neutrophils Absolute 5.7 1.4 - 7.0 x10E3/uL   Lymphocytes Absolute 3.1 0.7 - 3.1 x10E3/uL   Monocytes Absolute 0.5 0.1 - 0.9 x10E3/uL   EOS (ABSOLUTE) 0.1 0.0 - 0.4 x10E3/uL   Basophils Absolute 0.1 0.0 - 0.2 x10E3/uL   Immature Granulocytes 1 Not Estab. %   Immature Grans (Abs) 0.1 0.0 - 0.1 x10E3/uL  Comprehensive metabolic panel  Result Value Ref Range   Glucose 88 70 - 99 mg/dL   BUN 8 6 - 24 mg/dL   Creatinine, Ser 0.77 0.57 - 1.00 mg/dL   eGFR 99 >59 mL/min/1.73   BUN/Creatinine Ratio 10 9 - 23   Sodium 141 134 - 144 mmol/L   Potassium 4.2 3.5 - 5.2 mmol/L   Chloride 107 (H) 96 - 106 mmol/L   CO2 20 20 - 29 mmol/L   Calcium 9.4 8.7 - 10.2 mg/dL   Total Protein 6.7 6.0 - 8.5 g/dL   Albumin 4.5 3.8 - 4.8 g/dL   Globulin, Total 2.2 1.5 - 4.5 g/dL   Albumin/Globulin Ratio 2.0 1.2 - 2.2   Bilirubin Total 0.3 0.0 - 1.2 mg/dL   Alkaline Phosphatase 81 44 - 121 IU/L   AST 19 0 - 40 IU/L   ALT 17 0 - 32 IU/L  Lipid Panel w/o Chol/HDL Ratio  Result Value Ref Range   Cholesterol, Total 164 100 - 199 mg/dL   Triglycerides 233 (H) 0 - 149 mg/dL   HDL 36 (L) >39 mg/dL  VLDL  Cholesterol Cal 39 5 - 40 mg/dL   LDL Chol Calc (NIH) 89 0 - 99 mg/dL  Urinalysis, Routine w reflex microscopic  Result Value Ref Range   Specific Gravity, UA 1.025 1.005 - 1.030   pH, UA 7.0 5.0 - 7.5   Color, UA Yellow Yellow   Appearance Ur Cloudy (A) Clear   Leukocytes,UA 1+ (A) Negative   Protein,UA Trace (A) Negative/Trace   Glucose, UA Negative Negative   Ketones, UA Negative Negative   RBC, UA Negative Negative   Bilirubin, UA Negative Negative   Urobilinogen, Ur 1.0 0.2 - 1.0 mg/dL   Nitrite, UA Negative Negative   Microscopic Examination See below:   TSH  Result Value Ref Range   TSH 3.170 0.450 - 4.500 uIU/mL  VITAMIN D 25 Hydroxy (Vit-D Deficiency, Fractures)  Result Value Ref Range   Vit D, 25-Hydroxy 11.8 (L) 30.0 - 100.0 ng/mL  Bayer DCA Hb A1c Waived  Result Value Ref Range   HB A1C (BAYER DCA - WAIVED) 4.8 4.8 - 5.6 %  Prolactin  Result Value Ref Range   Prolactin 22.9 4.8 - 23.3 ng/mL      Assessment & Plan:   Problem List Items Addressed This Visit       Other   Contraceptive surveillance    Would like to go back on depo. Pregnancy negative. Restart depo today.      Relevant Orders   Pregnancy, urine   Other Visit Diagnoses     Subacute cough    -  Primary   Mostly resolved. Continue to monitor. Continue omeprazole. Call with any concerns.        Follow up plan: Return April, physical.

## 2022-06-16 NOTE — Assessment & Plan Note (Signed)
Would like to go back on depo. Pregnancy negative. Restart depo today.

## 2022-06-17 LAB — PREGNANCY, URINE: Preg Test, Ur: NEGATIVE

## 2022-09-12 ENCOUNTER — Other Ambulatory Visit: Payer: Self-pay | Admitting: Family Medicine

## 2022-09-12 DIAGNOSIS — Z1231 Encounter for screening mammogram for malignant neoplasm of breast: Secondary | ICD-10-CM

## 2022-09-16 ENCOUNTER — Ambulatory Visit (INDEPENDENT_AMBULATORY_CARE_PROVIDER_SITE_OTHER): Payer: Managed Care, Other (non HMO) | Admitting: Family Medicine

## 2022-09-16 ENCOUNTER — Encounter: Payer: Self-pay | Admitting: Family Medicine

## 2022-09-16 VITALS — BP 97/68 | HR 67 | Temp 98.7°F | Ht 65.5 in | Wt 233.1 lb

## 2022-09-16 DIAGNOSIS — K921 Melena: Secondary | ICD-10-CM | POA: Diagnosis not present

## 2022-09-16 DIAGNOSIS — Z3042 Encounter for surveillance of injectable contraceptive: Secondary | ICD-10-CM | POA: Diagnosis not present

## 2022-09-16 DIAGNOSIS — E559 Vitamin D deficiency, unspecified: Secondary | ICD-10-CM

## 2022-09-16 DIAGNOSIS — M5442 Lumbago with sciatica, left side: Secondary | ICD-10-CM

## 2022-09-16 DIAGNOSIS — Z0000 Encounter for general adult medical examination without abnormal findings: Secondary | ICD-10-CM

## 2022-09-16 DIAGNOSIS — R0602 Shortness of breath: Secondary | ICD-10-CM

## 2022-09-16 MED ORDER — ONDANSETRON 4 MG PO TBDP: ORAL_TABLET | ORAL | 1 refills | Status: DC

## 2022-09-16 MED ORDER — CETIRIZINE HCL 10 MG PO TABS: 10.0000 mg | ORAL_TABLET | Freq: Every day | ORAL | 3 refills | Status: DC

## 2022-09-16 MED ORDER — ALBUTEROL SULFATE HFA 108 (90 BASE) MCG/ACT IN AERS: 2.0000 | INHALATION_SPRAY | Freq: Four times a day (QID) | RESPIRATORY_TRACT | 3 refills | Status: AC | PRN

## 2022-09-16 MED ORDER — SUMATRIPTAN SUCCINATE 100 MG PO TABS: 50.0000 mg | ORAL_TABLET | ORAL | 12 refills | Status: DC | PRN

## 2022-09-16 MED ORDER — OMEPRAZOLE 20 MG PO CPDR: 20.0000 mg | DELAYED_RELEASE_CAPSULE | Freq: Every day | ORAL | 3 refills | Status: DC

## 2022-09-16 MED ORDER — MONTELUKAST SODIUM 10 MG PO TABS: 10.0000 mg | ORAL_TABLET | Freq: Every day | ORAL | 3 refills | Status: DC

## 2022-09-16 MED ORDER — MEDROXYPROGESTERONE ACETATE 150 MG/ML IM SUSY
150.0000 mg | PREFILLED_SYRINGE | Freq: Once | INTRAMUSCULAR | Status: AC
Start: 2022-09-16 — End: 2022-09-16
  Administered 2022-09-16: 150 mg via INTRAMUSCULAR

## 2022-09-16 NOTE — Progress Notes (Signed)
BP 97/68   Pulse 67   Temp 98.7 F (37.1 C) (Oral)   Ht 5' 5.5" (1.664 m)   Wt 233 lb 1.6 oz (105.7 kg)   SpO2 99%   BMI 38.20 kg/m    Subjective:    Patient ID: Katrina Long, female    DOB: 06/22/1980, 42 y.o.   MRN: [id number]  HPI: Katrina CheekDawn M Texeira is a 42 y.o. female presenting on 09/16/2022 for comprehensive medical examination. Current medical complaints include:{Blank single:19197::"none","***"}  Menopausal Symptoms: no  Depression Screen done today and results listed below:     09/16/2022    4:13 PM 06/16/2022    4:19 PM 06/16/2022    4:18 PM 05/24/2022    3:38 PM 04/08/2022   10:50 AM  Depression screen PHQ 2/9  Decreased Interest 1 1 0 0 1  Down, Depressed, Hopeless 1 1 0 0 0  PHQ - 2 Score 2 2 0 0 1  Altered sleeping 0 0 0 2 1  Tired, decreased energy 1 1 0 2 1  Change in appetite 0 0 0 1 0  Feeling bad or failure about yourself  0 0 0 0 0  Trouble concentrating 0 0 0 0 0  Moving slowly or fidgety/restless 0 0 0 0 0  Suicidal thoughts 0 0 0 0 0  PHQ-9 Score 3 3 0 5 3  Difficult doing work/chores Not difficult at all Not difficult at all Not difficult at all  Not difficult at all    Past Medical History:  Past Medical History:  Diagnosis Date  . Allergy   . Anemia   . History of abnormal cervical Pap smear   . Hypoglobulinemia   . Hypoproteinemia     Surgical History:  History reviewed. No pertinent surgical history.  Medications:  Current Outpatient Medications on File Prior to Visit  Medication Sig  . albuterol (VENTOLIN HFA) 108 (90 Base) MCG/ACT inhaler INHALE 2 PUFFS INTO THE LUNGS EVERY 6 HOURS AS NEEDED FOR WHEEZING OR SHORTNESS OF BREATH  . cetirizine (ZYRTEC) 10 MG tablet TAKE 1 TABLET(10 MG) BY MOUTH DAILY  . Cranberry 300 MG tablet Take 1 tablet (300 mg total) by mouth 2 (two) times daily.  Marland Kitchen. ketotifen (ZADITOR) 0.025 % ophthalmic solution INSTILL 1 DROP INTO BOTH EYES EVERY DAY  . montelukast (SINGULAIR) 10 MG tablet TAKE 1 TABLET(10 MG) BY MOUTH  AT BEDTIME  . omeprazole (PRILOSEC) 20 MG capsule Take 1 capsule (20 mg total) by mouth daily.  . ondansetron (ZOFRAN-ODT) 4 MG disintegrating tablet DISSOLVE 1 TABLET(4 MG) ON THE TONGUE EVERY 8 HOURS AS NEEDED FOR NAUSEA OR VOMITING  . PREVIDENT 0.2 % SOLN Take by mouth once a week.  . Probiotic Product (PROBIOTIC-10) CHEW Chew 1 Dose by mouth daily.  . SODIUM FLUORIDE 5000 PLUS 1.1 % CREA dental cream at bedtime.  . SUMAtriptan (IMITREX) 100 MG tablet Take 0.5-1 tablets (50-100 mg total) by mouth every 2 (two) hours as needed for migraine. May repeat in 2 hours if headache persists or recurs.  . triamcinolone (NASACORT ALLERGY 24HR) 55 MCG/ACT AERO nasal inhaler Place 2 sprays into the nose daily.  . Vitamin D, Ergocalciferol, (DRISDOL) 1.25 MG (50000 UNIT) CAPS capsule TAKE 1 CAPSULE BY MOUTH EVERY 7 DAYS   Current Facility-Administered Medications on File Prior to Visit  Medication  . medroxyPROGESTERone (DEPO-PROVERA) injection 150 mg    Allergies:  Allergies  Allergen Reactions  . Amoxicillin Rash  . Entex Lq [Phenylephrine-Guaifenesin] Rash  .  Penicillins Rash    Social History:  Social History   Socioeconomic History  . Marital status: Single    Spouse name: Not on file  . Number of children: Not on file  . Years of education: Not on file  . Highest education level: Not on file  Occupational History  . Not on file  Tobacco Use  . Smoking status: Never  . Smokeless tobacco: Never  Vaping Use  . Vaping Use: Never used  Substance and Sexual Activity  . Alcohol use: No  . Drug use: No  . Sexual activity: Yes  Other Topics Concern  . Not on file  Social History Narrative  . Not on file   Social Determinants of Health   Financial Resource Strain: Not on file  Food Insecurity: Not on file  Transportation Needs: Not on file  Physical Activity: Not on file  Stress: Not on file  Social Connections: Not on file  Intimate Partner Violence: Not on file   Social  History   Tobacco Use  Smoking Status Never  Smokeless Tobacco Never   Social History   Substance and Sexual Activity  Alcohol Use No    Family History:  Family History  Problem Relation Age of Onset  . Mental illness Mother   . Lung disease Mother   . Diabetes Sister   . Cancer Daughter   . Breast cancer Maternal Aunt   . Cancer Maternal Aunt 50       Breast  . Hypertension Maternal Grandmother   . Mental illness Brother     Past medical history, surgical history, medications, allergies, family history and social history reviewed with patient today and changes made to appropriate areas of the chart.   Review of Systems  Constitutional:  Positive for diaphoresis. Negative for chills, fever, malaise/fatigue and weight loss.  HENT: Negative.    Eyes: Negative.   Respiratory: Negative.    Cardiovascular: Negative.   Gastrointestinal:  Positive for blood in stool. Negative for abdominal pain, constipation, diarrhea, heartburn, melena, nausea and vomiting.  Genitourinary:  Positive for frequency. Negative for dysuria, flank pain, hematuria and urgency.  Musculoskeletal:  Positive for back pain and myalgias. Negative for falls, joint pain and neck pain.  Skin: Negative.   Neurological:  Positive for weakness (L leg weakness with pain). Negative for dizziness, tingling, tremors, sensory change, speech change, focal weakness, seizures, loss of consciousness and headaches.  Endo/Heme/Allergies:  Positive for environmental allergies. Negative for polydipsia. Does not bruise/bleed easily.  Psychiatric/Behavioral: Negative.     All other ROS negative except what is listed above and in the HPI.      Objective:    BP 97/68   Pulse 67   Temp 98.7 F (37.1 C) (Oral)   Ht 5' 5.5" (1.664 m)   Wt 233 lb 1.6 oz (105.7 kg)   SpO2 99%   BMI 38.20 kg/m   Wt Readings from Last 3 Encounters:  09/16/22 233 lb 1.6 oz (105.7 kg)  06/16/22 237 lb 14.4 oz (107.9 kg)  05/24/22 237 lb 9.6  oz (107.8 kg)    Physical Exam  Results for orders placed or performed in visit on 06/16/22  Pregnancy, urine  Result Value Ref Range   Preg Test, Ur Negative Negative      Assessment & Plan:   Problem List Items Addressed This Visit       Other   Contraceptive surveillance   Relevant Medications   medroxyPROGESTERone Acetate SUSY 150 mg  Other Visit Diagnoses     Routine general medical examination at a health care facility    -  Primary   Relevant Orders   CBC with Differential/Platelet   Comprehensive metabolic panel   Lipid Panel w/o Chol/HDL Ratio   Urinalysis, Routine w reflex microscopic   TSH   VITAMIN D 25 Hydroxy (Vit-D Deficiency, Fractures)        Follow up plan: No follow-ups on file.   LABORATORY TESTING:  - Pap smear: up to date  IMMUNIZATIONS:   - Tdap: Tetanus vaccination status reviewed: last tetanus booster within 10 years. - Influenza: Up to date - Pneumovax: Not applicable - Prevnar: Not applicable - COVID: Refused - HPV: Not applicable - Shingrix vaccine: Not applicable  SCREENING: -Mammogram: Up to date  - Colonoscopy: Not applicable   PATIENT COUNSELING:   Advised to take 1 mg of folate supplement per day if capable of pregnancy.   Sexuality: Discussed sexually transmitted diseases, partner selection, use of condoms, avoidance of unintended pregnancy  and contraceptive alternatives.   Advised to avoid cigarette smoking.  I discussed with the patient that most people either abstain from alcohol or drink within safe limits (<=14/week and <=4 drinks/occasion for males, <=7/weeks and <= 3 drinks/occasion for females) and that the risk for alcohol disorders and other health effects rises proportionally with the number of drinks per week and how often a drinker exceeds daily limits.  Discussed cessation/primary prevention of drug use and availability of treatment for abuse.   Diet: Encouraged to adjust caloric intake to maintain  or  achieve ideal body weight, to reduce intake of dietary saturated fat and total fat, to limit sodium intake by avoiding high sodium foods and not adding table salt, and to maintain adequate dietary potassium and calcium preferably from fresh fruits, vegetables, and low-fat dairy products.    stressed the importance of regular exercise  Injury prevention: Discussed safety belts, safety helmets, smoke detector, smoking near bedding or upholstery.   Dental health: Discussed importance of regular tooth brushing, flossing, and dental visits.    NEXT PREVENTATIVE PHYSICAL DUE IN 1 YEAR. No follow-ups on file.

## 2022-09-17 ENCOUNTER — Encounter: Payer: Self-pay | Admitting: Family Medicine

## 2022-09-17 LAB — COMPREHENSIVE METABOLIC PANEL
ALT: 15 IU/L (ref 0–32)
AST: 15 IU/L (ref 0–40)
Albumin/Globulin Ratio: 2.1 (ref 1.2–2.2)
Albumin: 4.5 g/dL (ref 3.9–4.9)
Alkaline Phosphatase: 73 IU/L (ref 44–121)
BUN/Creatinine Ratio: 11 (ref 9–23)
BUN: 8 mg/dL (ref 6–24)
Bilirubin Total: 0.3 mg/dL (ref 0.0–1.2)
CO2: 22 mmol/L (ref 20–29)
Calcium: 9.4 mg/dL (ref 8.7–10.2)
Chloride: 103 mmol/L (ref 96–106)
Creatinine, Ser: 0.75 mg/dL (ref 0.57–1.00)
Globulin, Total: 2.1 g/dL (ref 1.5–4.5)
Glucose: 85 mg/dL (ref 70–99)
Potassium: 4.1 mmol/L (ref 3.5–5.2)
Sodium: 140 mmol/L (ref 134–144)
Total Protein: 6.6 g/dL (ref 6.0–8.5)
eGFR: 102 mL/min/{1.73_m2} (ref >59)

## 2022-09-17 LAB — URINALYSIS, ROUTINE W REFLEX MICROSCOPIC
Bilirubin, UA: NEGATIVE
Glucose, UA: NEGATIVE
Ketones, UA: NEGATIVE
Nitrite, UA: NEGATIVE
RBC, UA: NEGATIVE
Specific Gravity, UA: 1.022 (ref 1.005–1.030)
Urobilinogen, Ur: 0.2 mg/dL (ref 0.2–1.0)
pH, UA: 7 (ref 5.0–7.5)

## 2022-09-17 LAB — CBC WITH DIFFERENTIAL/PLATELET
Basophils Absolute: 0.1 10*3/uL (ref 0.0–0.2)
Basos: 1 %
EOS (ABSOLUTE): 0.1 10*3/uL (ref 0.0–0.4)
Eos: 1 %
Hematocrit: 42.5 % (ref 34.0–46.6)
Hemoglobin: 14.4 g/dL (ref 11.1–15.9)
Immature Grans (Abs): 0.1 10*3/uL (ref 0.0–0.1)
Immature Granulocytes: 1 %
Lymphocytes Absolute: 3.1 10*3/uL (ref 0.7–3.1)
Lymphs: 33 %
MCH: 30.4 pg (ref 26.6–33.0)
MCHC: 33.9 g/dL (ref 31.5–35.7)
MCV: 90 fL (ref 79–97)
Monocytes Absolute: 0.5 10*3/uL (ref 0.1–0.9)
Monocytes: 5 %
Neutrophils Absolute: 5.5 10*3/uL (ref 1.4–7.0)
Neutrophils: 59 %
Platelets: 262 10*3/uL (ref 150–450)
RBC: 4.73 x10E6/uL (ref 3.77–5.28)
RDW: 11.7 % (ref 11.7–15.4)
WBC: 9.3 10*3/uL (ref 3.4–10.8)

## 2022-09-17 LAB — LIPID PANEL W/O CHOL/HDL RATIO
Cholesterol, Total: 175 mg/dL (ref 100–199)
HDL: 37 mg/dL — ABNORMAL LOW (ref >39)
LDL Chol Calc (NIH): 97 mg/dL (ref 0–99)
Triglycerides: 244 mg/dL — ABNORMAL HIGH (ref 0–149)
VLDL Cholesterol Cal: 41 mg/dL — ABNORMAL HIGH (ref 5–40)

## 2022-09-17 LAB — MICROSCOPIC EXAMINATION
Casts: NONE SEEN /lpf
Epithelial Cells (non renal): >10 /hpf — AB (ref 0–10)

## 2022-09-17 LAB — TSH: TSH: 6.06 u[IU]/mL — ABNORMAL HIGH (ref 0.450–4.500)

## 2022-09-17 LAB — VITAMIN D 25 HYDROXY (VIT D DEFICIENCY, FRACTURES): Vit D, 25-Hydroxy: 11.7 ng/mL — ABNORMAL LOW (ref 30.0–100.0)

## 2022-09-17 NOTE — Assessment & Plan Note (Signed)
Depo shot given today 

## 2022-09-17 NOTE — Assessment & Plan Note (Signed)
Rechecking labs today. Await results. Treat as needed.  °

## 2022-09-19 ENCOUNTER — Encounter: Payer: 59 | Admitting: Family Medicine

## 2022-09-20 ENCOUNTER — Other Ambulatory Visit: Payer: Self-pay | Admitting: Family Medicine

## 2022-09-20 DIAGNOSIS — R7989 Other specified abnormal findings of blood chemistry: Secondary | ICD-10-CM

## 2022-09-20 MED ORDER — VITAMIN D (ERGOCALCIFEROL) 1.25 MG (50000 UNIT) PO CAPS: 50000.0000 [IU] | ORAL_CAPSULE | ORAL | 1 refills | Status: DC

## 2022-09-22 ENCOUNTER — Ambulatory Visit
Admission: RE | Admit: 2022-09-22 | Discharge: 2022-09-22 | Disposition: A | Discharge status: Home / Self Care | Payer: Managed Care, Other (non HMO) | Source: Ambulatory Visit | Attending: Family Medicine | Admitting: Family Medicine

## 2022-09-22 ENCOUNTER — Ambulatory Visit
Admission: RE | Admit: 2022-09-22 | Discharge: 2022-09-22 | Disposition: A | Discharge status: Home / Self Care | Payer: Managed Care, Other (non HMO) | Attending: Family Medicine | Admitting: Family Medicine

## 2022-09-22 DIAGNOSIS — M5442 Lumbago with sciatica, left side: Secondary | ICD-10-CM | POA: Insufficient documentation

## 2022-09-28 ENCOUNTER — Ambulatory Visit
Admission: RE | Admit: 2022-09-28 | Discharge: 2022-09-28 | Disposition: A | Discharge status: Home / Self Care | Payer: Managed Care, Other (non HMO) | Source: Ambulatory Visit | Attending: Family Medicine | Admitting: Family Medicine

## 2022-09-28 DIAGNOSIS — Z1231 Encounter for screening mammogram for malignant neoplasm of breast: Secondary | ICD-10-CM | POA: Insufficient documentation

## 2022-10-18 ENCOUNTER — Encounter: Payer: Self-pay | Admitting: Family Medicine

## 2022-10-18 ENCOUNTER — Telehealth: Payer: Self-pay

## 2022-10-18 ENCOUNTER — Other Ambulatory Visit: Payer: Self-pay | Admitting: Family Medicine

## 2022-10-18 DIAGNOSIS — Z131 Encounter for screening for diabetes mellitus: Secondary | ICD-10-CM

## 2022-10-18 NOTE — Telephone Encounter (Signed)
Spoke with patient and made her aware of Dr Johnson's recommendations. Patient verbalized understanding and has no further questions.  

## 2022-10-18 NOTE — Telephone Encounter (Signed)
Order for TSH is already in the computer and she was supposed to be scheduled for a lab visit a month ago. Please get scheduled for a lab visit. We can check her A1c at that time.

## 2022-10-18 NOTE — Telephone Encounter (Unsigned)
Copied from CRM [id number]. Topic: General - Other >> Oct 18, 2022 10:51 AM Macon Large wrote: Reason for CRM: Pt requests to speak with a nurse in the office and specifically stated that she does not want to speak with someone at the call center. Cb# [phone]

## 2022-10-19 NOTE — Telephone Encounter (Signed)
Pt already scheduled for 10/21/2022.

## 2022-10-21 ENCOUNTER — Other Ambulatory Visit: Payer: Managed Care, Other (non HMO)

## 2022-10-21 DIAGNOSIS — Z131 Encounter for screening for diabetes mellitus: Secondary | ICD-10-CM

## 2022-10-21 DIAGNOSIS — R7989 Other specified abnormal findings of blood chemistry: Secondary | ICD-10-CM

## 2022-10-21 LAB — BAYER DCA HB A1C WAIVED: HB A1C (BAYER DCA - WAIVED): 4.9 % (ref 4.8–5.6)

## 2022-10-22 LAB — THYROID PANEL WITH TSH
Free Thyroxine Index: 2 (ref 1.2–4.9)
T3 Uptake Ratio: 28 % (ref 24–39)
T4, Total: 7 ug/dL (ref 4.5–12.0)
TSH: 6.18 u[IU]/mL — ABNORMAL HIGH (ref 0.450–4.500)

## 2022-10-23 LAB — FECAL OCCULT BLOOD, IMMUNOCHEMICAL: Fecal Occult Bld: NEGATIVE

## 2022-12-09 ENCOUNTER — Ambulatory Visit: Payer: Managed Care, Other (non HMO)

## 2022-12-09 DIAGNOSIS — Z3042 Encounter for surveillance of injectable contraceptive: Secondary | ICD-10-CM | POA: Diagnosis not present

## 2022-12-20 ENCOUNTER — Encounter: Payer: Self-pay | Admitting: Family Medicine

## 2023-01-27 ENCOUNTER — Ambulatory Visit
Admission: RE | Admit: 2023-01-27 | Discharge: 2023-01-27 | Disposition: A | Discharge status: Home / Self Care | Payer: Managed Care, Other (non HMO) | Source: Ambulatory Visit | Attending: Family Medicine

## 2023-01-27 ENCOUNTER — Ambulatory Visit: Payer: Managed Care, Other (non HMO) | Admitting: Family Medicine

## 2023-01-27 ENCOUNTER — Encounter: Payer: Self-pay | Admitting: Family Medicine

## 2023-01-27 ENCOUNTER — Ambulatory Visit
Admission: RE | Admit: 2023-01-27 | Discharge: 2023-01-27 | Disposition: A | Discharge status: Home / Self Care | Payer: Managed Care, Other (non HMO) | Attending: Family Medicine | Admitting: Family Medicine

## 2023-01-27 VITALS — BP 107/75 | HR 76 | Temp 99.0°F | Wt 222.6 lb

## 2023-01-27 DIAGNOSIS — R509 Fever, unspecified: Secondary | ICD-10-CM | POA: Diagnosis not present

## 2023-01-27 NOTE — Progress Notes (Signed)
BP 107/75   Pulse 76   Temp 99 F (37.2 C) (Oral)   Wt 222 lb 9.6 oz (101 kg)   SpO2 98%   BMI 36.48 kg/m    Subjective:    Patient ID: Katrina Long, female    DOB: Dec 09, 1980, 42 y.o.   MRN: [id number]  HPI: Katrina Long is a 42 y.o. female  Chief Complaint  Patient presents with   Fever    Has had covid a couple of weeks ago and has had fever on and off since    Had covid about 3 weeks ago. Was sick for a few days, but that seems to have resolved now. She has been feeling well otherwise but has been running low grade fevers at night usually 99-100.5. No symptoms. She did have a couple of episodes of some minor SOB. She has also pulled a couple of ticks off herself. No other concerns or complaints at this time.   Relevant past medical, surgical, family and social history reviewed and updated as indicated. Interim medical history since our last visit reviewed. Allergies and medications reviewed and updated.  Review of Systems  Constitutional:  Positive for fever. Negative for activity change, appetite change, chills, diaphoresis, fatigue and unexpected weight change.  HENT: Negative.    Respiratory:  Positive for shortness of breath. Negative for apnea, cough, choking, chest tightness, wheezing and stridor.   Cardiovascular: Negative.   Musculoskeletal: Negative.   Neurological: Negative.   Psychiatric/Behavioral: Negative.      Per HPI unless specifically indicated above     Objective:    BP 107/75   Pulse 76   Temp 99 F (37.2 C) (Oral)   Wt 222 lb 9.6 oz (101 kg)   SpO2 98%   BMI 36.48 kg/m   Wt Readings from Last 3 Encounters:  01/27/23 222 lb 9.6 oz (101 kg)  09/16/22 233 lb 1.6 oz (105.7 kg)  06/16/22 237 lb 14.4 oz (107.9 kg)    Physical Exam Vitals and nursing note reviewed.  Constitutional:      General: She is not in acute distress.    Appearance: Normal appearance. She is obese. She is not ill-appearing, toxic-appearing or diaphoretic.  HENT:      Head: Normocephalic and atraumatic.     Right Ear: External ear normal.     Left Ear: External ear normal.     Nose: Nose normal.     Mouth/Throat:     Mouth: Mucous membranes are moist.     Pharynx: Oropharynx is clear.  Eyes:     General: No scleral icterus.       Right eye: No discharge.        Left eye: No discharge.     Extraocular Movements: Extraocular movements intact.     Conjunctiva/sclera: Conjunctivae normal.     Pupils: Pupils are equal, round, and reactive to light.  Cardiovascular:     Rate and Rhythm: Normal rate and regular rhythm.     Pulses: Normal pulses.     Heart sounds: Normal heart sounds. No murmur heard.    No friction rub. No gallop.  Pulmonary:     Effort: Pulmonary effort is normal. No respiratory distress.     Breath sounds: Normal breath sounds. No stridor. No wheezing, rhonchi or rales.     Comments: Decreased breath sounds in the R base Chest:     Chest wall: No tenderness.  Musculoskeletal:        General:  Normal range of motion.     Cervical back: Normal range of motion and neck supple.  Skin:    General: Skin is warm and dry.     Capillary Refill: Capillary refill takes less than 2 seconds.     Coloration: Skin is not jaundiced or pale.     Findings: No bruising, erythema, lesion or rash.  Neurological:     General: No focal deficit present.     Mental Status: She is alert and oriented to person, place, and time. Mental status is at baseline.  Psychiatric:        Mood and Affect: Mood normal.        Behavior: Behavior normal.        Thought Content: Thought content normal.        Judgment: Judgment normal.     Results for orders placed or performed in visit on 10/21/22  Bayer DCA Hb A1c Waived  Result Value Ref Range   HB A1C (BAYER DCA - WAIVED) 4.9 4.8 - 5.6 %  Thyroid Panel With TSH  Result Value Ref Range   TSH 6.180 (H) 0.450 - 4.500 uIU/mL   T4, Total 7.0 4.5 - 12.0 ug/dL   T3 Uptake Ratio 28 24 - 39 %   Free Thyroxine  Index 2.0 1.2 - 4.9      Assessment & Plan:   Problem List Items Addressed This Visit   None Visit Diagnoses     Fever, unspecified fever cause    -  Primary   Will check labs and CXR. Call with any concerns or if not getting better.   Relevant Orders   CBC with Differential/Platelet   Comprehensive metabolic panel   TSH   Lyme Disease Serology w/Reflex   Spotted Fever Group Antibodies   Ehrlichia Antibody Panel   DG Chest 2 View        Follow up plan: Return if symptoms worsen or fail to improve.

## 2023-02-01 LAB — CBC WITH DIFFERENTIAL/PLATELET
Basophils Absolute: 0.1 10*3/uL (ref 0.0–0.2)
Basos: 1 %
EOS (ABSOLUTE): 0.1 10*3/uL (ref 0.0–0.4)
Eos: 1 %
Hematocrit: 42.1 % (ref 34.0–46.6)
Hemoglobin: 13.7 g/dL (ref 11.1–15.9)
Immature Grans (Abs): 0.1 10*3/uL (ref 0.0–0.1)
Immature Granulocytes: 1 %
Lymphocytes Absolute: 2.8 10*3/uL (ref 0.7–3.1)
Lymphs: 32 %
MCH: 29.8 pg (ref 26.6–33.0)
MCHC: 32.5 g/dL (ref 31.5–35.7)
MCV: 92 fL (ref 79–97)
Monocytes Absolute: 0.5 10*3/uL (ref 0.1–0.9)
Monocytes: 6 %
Neutrophils Absolute: 5.3 10*3/uL (ref 1.4–7.0)
Neutrophils: 59 %
Platelets: 251 10*3/uL (ref 150–450)
RBC: 4.59 x10E6/uL (ref 3.77–5.28)
RDW: 12.5 % (ref 11.7–15.4)
WBC: 8.8 10*3/uL (ref 3.4–10.8)

## 2023-02-01 LAB — SPOTTED FEVER GROUP ANTIBODIES
Spotted Fever Group IgG: <1:64 {titer}
Spotted Fever Group IgM: <1:64 {titer}

## 2023-02-01 LAB — COMPREHENSIVE METABOLIC PANEL
ALT: 15 IU/L (ref 0–32)
AST: 18 IU/L (ref 0–40)
Albumin: 4.2 g/dL (ref 3.9–4.9)
Alkaline Phosphatase: 61 IU/L (ref 44–121)
BUN/Creatinine Ratio: 8 — ABNORMAL LOW (ref 9–23)
BUN: 7 mg/dL (ref 6–24)
Bilirubin Total: 0.3 mg/dL (ref 0.0–1.2)
CO2: 23 mmol/L (ref 20–29)
Calcium: 9.2 mg/dL (ref 8.7–10.2)
Chloride: 104 mmol/L (ref 96–106)
Creatinine, Ser: 0.89 mg/dL (ref 0.57–1.00)
Globulin, Total: 2.1 g/dL (ref 1.5–4.5)
Glucose: 81 mg/dL (ref 70–99)
Potassium: 4.2 mmol/L (ref 3.5–5.2)
Sodium: 139 mmol/L (ref 134–144)
Total Protein: 6.3 g/dL (ref 6.0–8.5)
eGFR: 83 mL/min/{1.73_m2} (ref >59)

## 2023-02-01 LAB — EHRLICHIA ANTIBODY PANEL
E. Chaffeensis (HME) IgM Titer: NEGATIVE
E.Chaffeensis (HME) IgG: NEGATIVE
HGE IgG Titer: NEGATIVE
HGE IgM Titer: NEGATIVE

## 2023-02-01 LAB — LYME DISEASE SEROLOGY W/REFLEX: Lyme Total Antibody EIA: NEGATIVE

## 2023-02-01 LAB — TSH: TSH: 4.53 u[IU]/mL — ABNORMAL HIGH (ref 0.450–4.500)

## 2023-02-24 ENCOUNTER — Ambulatory Visit (INDEPENDENT_AMBULATORY_CARE_PROVIDER_SITE_OTHER): Payer: Managed Care, Other (non HMO)

## 2023-02-24 DIAGNOSIS — Z3042 Encounter for surveillance of injectable contraceptive: Secondary | ICD-10-CM

## 2023-03-16 ENCOUNTER — Other Ambulatory Visit: Payer: Self-pay | Admitting: Family Medicine

## 2023-03-16 NOTE — Telephone Encounter (Signed)
Requested medication (s) are due for refill today: Yes  Requested medication (s) are on the active medication list: Yes  Last refill:  09/20/22  Future visit scheduled: Yes  Notes to clinic:  Manual review required     Requested Prescriptions  Pending Prescriptions Disp Refills   Vitamin D, Ergocalciferol, (DRISDOL) 1.25 MG (50000 UNIT) CAPS capsule [Pharmacy Med Name: VITAMIN D2 50,000IU (ERGO) CAP RX] 12 capsule 1    Sig: TAKE 1 CAPSULE BY MOUTH EVERY 7 DAYS     Endocrinology:  Vitamins - Vitamin D Supplementation 2 Failed - 03/16/2023  3:36 AM      Failed - Manual Review: Route requests for 50,000 IU strength to the provider      Failed - Vitamin D in normal range and within 360 days    Vit D, 25-Hydroxy  Date Value Ref Range Status  09/16/2022 11.7 (L) 30.0 - 100.0 ng/mL Final    Comment:    Vitamin D deficiency has been defined by the Institute of Medicine and an Endocrine Society practice guideline as a level of serum 25-OH vitamin D less than 20 ng/mL (1,2). The Endocrine Society went on to further define vitamin D insufficiency as a level between 21 and 29 ng/mL (2). 1. IOM (Institute of Medicine). 2010. Dietary reference    intakes for calcium and D. [place]: The    [employer]. 2. Holick MF, Binkley [place], Bischoff-Ferrari HA, et al.    Evaluation, treatment, and prevention of vitamin D    deficiency: an Endocrine Society clinical practice    guideline. JCEM. 2011 Jul; 96(7):1911-30.          Passed - Ca in normal range and within 360 days    Calcium  Date Value Ref Range Status  01/27/2023 9.2 8.7 - 10.2 mg/dL Final   Calcium, Total  Date Value Ref Range Status  01/27/2014 8.8 8.5 - 10.1 mg/dL Final         Passed - Valid encounter within last 12 months    Recent Outpatient Visits           1 month ago Fever, unspecified fever cause   [place] [facility] [place], Megan P, DO   6 months ago Routine general medical  examination at a health care facility   [place] [facility] [place] P, DO   9 months ago Subacute cough   [place] [facility] [place], Megan P, DO   9 months ago Subacute cough   [place] [facility] [place], Megan P, DO   11 months ago Acute maxillary sinusitis, recurrence not specified   [place] [facility] [place], Oswaldo Conroy, PA-C       Future Appointments             In 6 months Laural Benes, Oralia Rud, DO [place] [facility]

## 2023-05-19 ENCOUNTER — Ambulatory Visit (INDEPENDENT_AMBULATORY_CARE_PROVIDER_SITE_OTHER): Payer: Managed Care, Other (non HMO)

## 2023-05-19 DIAGNOSIS — Z3042 Encounter for surveillance of injectable contraceptive: Secondary | ICD-10-CM

## 2023-07-07 ENCOUNTER — Encounter: Payer: Self-pay | Admitting: Family Medicine

## 2023-07-07 ENCOUNTER — Ambulatory Visit: Payer: Managed Care, Other (non HMO) | Admitting: Family Medicine

## 2023-07-07 VITALS — BP 95/65 | HR 96 | Temp 99.8°F | Wt 231.6 lb

## 2023-07-07 DIAGNOSIS — R7989 Other specified abnormal findings of blood chemistry: Secondary | ICD-10-CM

## 2023-07-07 DIAGNOSIS — J069 Acute upper respiratory infection, unspecified: Secondary | ICD-10-CM

## 2023-07-07 DIAGNOSIS — Z1211 Encounter for screening for malignant neoplasm of colon: Secondary | ICD-10-CM

## 2023-07-07 DIAGNOSIS — K625 Hemorrhage of anus and rectum: Secondary | ICD-10-CM

## 2023-07-07 DIAGNOSIS — Z23 Encounter for immunization: Secondary | ICD-10-CM | POA: Diagnosis not present

## 2023-07-07 NOTE — Progress Notes (Signed)
BP 95/65   Pulse 96   Temp 99.8 F (37.7 C) (Oral)   Wt 231 lb 9.6 oz (105.1 kg)   SpO2 98%   BMI 37.95 kg/m    Subjective:    Patient ID: Katrina Long, female    DOB: 08-02-1980, 43 y.o.   MRN: [id number]  HPI: Katrina Long is a 43 y.o. female  Chief Complaint  Patient presents with   Blood In Stools    Patient says she did testing through [facility] for blood in her stool and results showed that she has blood in her stool. Patient says she was told to follow up with her provider in regards to her results.    Thyroid Problem    Patient says she has noticed some issues with symptoms discussed with provider previously. Patient says she would like to discuss the next recommended steps at today's visit.    RECTAL BLEEDING Duration: at least 6+ months Bright red rectal bleeding: yes  Amount of blood: moderate  Frequency: at random Melena: no  Spotting on toilet tissue: more than that  Anal fullness: no  Perianal pain: no  Severity: moderate Perianal irritation/itching: no  Constipation: yes  Chronic straining/valsava:  yes  Anal trauma/intercourse: no  Hemorrhoids: yes  Previous colonoscopy: no   Possibly exposed to someone with covid. Feeling a little punky. Would like to be tested.   ??HYPOTHYROIDISM Thyroid control status: unsure Fatigue: yes Cold intolerance: yes Heat intolerance: no Weight gain: yes Weight loss: no Constipation: yes Diarrhea/loose stools: no Palpitations: no Lower extremity edema: no Anxiety/depressed mood: no   Relevant past medical, surgical, family and social history reviewed and updated as indicated. Interim medical history since our last visit reviewed. Allergies and medications reviewed and updated.  Review of Systems  Constitutional: Negative.   Respiratory: Negative.    Cardiovascular: Negative.   Gastrointestinal:  Positive for anal bleeding, blood in stool and constipation. Negative for abdominal distention, abdominal pain,  diarrhea, nausea, rectal pain and vomiting.  Musculoskeletal: Negative.   Neurological: Negative.   Psychiatric/Behavioral: Negative.      Per HPI unless specifically indicated above     Objective:    BP 95/65   Pulse 96   Temp 99.8 F (37.7 C) (Oral)   Wt 231 lb 9.6 oz (105.1 kg)   SpO2 98%   BMI 37.95 kg/m   Wt Readings from Last 3 Encounters:  07/07/23 231 lb 9.6 oz (105.1 kg)  01/27/23 222 lb 9.6 oz (101 kg)  09/16/22 233 lb 1.6 oz (105.7 kg)    Physical Exam Vitals and nursing note reviewed.  Constitutional:      General: She is not in acute distress.    Appearance: Normal appearance. She is obese. She is not ill-appearing, toxic-appearing or diaphoretic.  HENT:     Head: Normocephalic and atraumatic.     Right Ear: External ear normal.     Left Ear: External ear normal.     Nose: Nose normal.     Mouth/Throat:     Mouth: Mucous membranes are moist.     Pharynx: Oropharynx is clear.  Eyes:     General: No scleral icterus.       Right eye: No discharge.        Left eye: No discharge.     Extraocular Movements: Extraocular movements intact.     Conjunctiva/sclera: Conjunctivae normal.     Pupils: Pupils are equal, round, and reactive to light.  Cardiovascular:  Rate and Rhythm: Normal rate and regular rhythm.     Pulses: Normal pulses.     Heart sounds: Normal heart sounds. No murmur heard.    No friction rub. No gallop.  Pulmonary:     Effort: Pulmonary effort is normal. No respiratory distress.     Breath sounds: Normal breath sounds. No stridor. No wheezing, rhonchi or rales.  Chest:     Chest wall: No tenderness.  Musculoskeletal:        General: Normal range of motion.     Cervical back: Normal range of motion and neck supple.  Skin:    General: Skin is warm and dry.     Capillary Refill: Capillary refill takes less than 2 seconds.     Coloration: Skin is not jaundiced or pale.     Findings: No bruising, erythema, lesion or rash.  Neurological:      General: No focal deficit present.     Mental Status: She is alert and oriented to person, place, and time. Mental status is at baseline.  Psychiatric:        Mood and Affect: Mood normal.        Behavior: Behavior normal.        Thought Content: Thought content normal.        Judgment: Judgment normal.     Results for orders placed or performed in visit on 01/27/23  CBC with Differential/Platelet   Collection Time: 01/27/23  1:59 PM  Result Value Ref Range   WBC 8.8 3.4 - 10.8 x10E3/uL   RBC 4.59 3.77 - 5.28 x10E6/uL   Hemoglobin 13.7 11.1 - 15.9 g/dL   Hematocrit 30.1 60.1 - 46.6 %   MCV 92 79 - 97 fL   MCH 29.8 26.6 - 33.0 pg   MCHC 32.5 31.5 - 35.7 g/dL   RDW 09.3 23.5 - 57.3 %   Platelets 251 150 - 450 x10E3/uL   Neutrophils 59 Not Estab. %   Lymphs 32 Not Estab. %   Monocytes 6 Not Estab. %   Eos 1 Not Estab. %   Basos 1 Not Estab. %   Neutrophils Absolute 5.3 1.4 - 7.0 x10E3/uL   Lymphocytes Absolute 2.8 0.7 - 3.1 x10E3/uL   Monocytes Absolute 0.5 0.1 - 0.9 x10E3/uL   EOS (ABSOLUTE) 0.1 0.0 - 0.4 x10E3/uL   Basophils Absolute 0.1 0.0 - 0.2 x10E3/uL   Immature Granulocytes 1 Not Estab. %   Immature Grans (Abs) 0.1 0.0 - 0.1 x10E3/uL  Comprehensive metabolic panel   Collection Time: 01/27/23  1:59 PM  Result Value Ref Range   Glucose 81 70 - 99 mg/dL   BUN 7 6 - 24 mg/dL   Creatinine, Ser 2.20 0.57 - 1.00 mg/dL   eGFR 83 >25 KY/HCW/2.37   BUN/Creatinine Ratio 8 (L) 9 - 23   Sodium 139 134 - 144 mmol/L   Potassium 4.2 3.5 - 5.2 mmol/L   Chloride 104 96 - 106 mmol/L   CO2 23 20 - 29 mmol/L   Calcium 9.2 8.7 - 10.2 mg/dL   Total Protein 6.3 6.0 - 8.5 g/dL   Albumin 4.2 3.9 - 4.9 g/dL   Globulin, Total 2.1 1.5 - 4.5 g/dL   Bilirubin Total 0.3 0.0 - 1.2 mg/dL   Alkaline Phosphatase 61 44 - 121 IU/L   AST 18 0 - 40 IU/L   ALT 15 0 - 32 IU/L  TSH   Collection Time: 01/27/23  1:59 PM  Result Value Ref Range  TSH 4.530 (H) 0.450 - 4.500 uIU/mL  Lyme  Disease Serology w/Reflex   Collection Time: 01/27/23  1:59 PM  Result Value Ref Range   Lyme Total Antibody EIA Negative Negative  Spotted Fever Group Antibodies   Collection Time: 01/27/23  1:59 PM  Result Value Ref Range   Spotted Fever Group IgG <1:64 Neg:<1:64   Spotted Fever Group IgM <1:64 Neg:<1:64   Result Comment Comment   Ehrlichia Antibody Panel   Collection Time: 01/27/23  1:59 PM  Result Value Ref Range   E.Chaffeensis (HME) IgG Negative Neg:<1:64   E. Chaffeensis (HME) IgM Titer Negative Neg:<1:20   HGE IgG Titer Negative Neg:<1:64   HGE IgM Titer Negative Neg:<1:20   Result Comment: Comment       Assessment & Plan:   Problem List Items Addressed This Visit   None Visit Diagnoses       Rectal bleeding    -  Primary   Ongoing off and on for over a year. Will get her into GI. Await their input.   Relevant Orders   Ambulatory referral to Gastroenterology   CBC with Differential/Platelet     Screening for colon cancer       Rectal bleeding ongoing off and on for over a year. Will get her into GI. Await their input.   Relevant Orders   Ambulatory referral to Gastroenterology     Abnormal thyroid blood test       Rechecking labs today. Await results. Treat as needed.   Relevant Orders   Thyroid Panel With TSH     Upper respiratory tract infection, unspecified type       Symptomatic care. Will check for COVID. Await results.   Relevant Orders   Novel Coronavirus, NAA ([facility])     Needs flu shot       Flu shot given today.   Relevant Orders   Flu vaccine trivalent PF, 6mos and older(Flulaval,Afluria,Fluarix,Fluzone)        Follow up plan: Return for As scheduled.

## 2023-07-08 LAB — CBC WITH DIFFERENTIAL/PLATELET
Basophils Absolute: 0.1 10*3/uL (ref 0.0–0.2)
Basos: 1 %
EOS (ABSOLUTE): 0.1 10*3/uL (ref 0.0–0.4)
Eos: 1 %
Hematocrit: 41.5 % (ref 34.0–46.6)
Hemoglobin: 13.8 g/dL (ref 11.1–15.9)
Immature Grans (Abs): 0.1 10*3/uL (ref 0.0–0.1)
Immature Granulocytes: 1 %
Lymphocytes Absolute: 1.4 10*3/uL (ref 0.7–3.1)
Lymphs: 16 %
MCH: 30.3 pg (ref 26.6–33.0)
MCHC: 33.3 g/dL (ref 31.5–35.7)
MCV: 91 fL (ref 79–97)
Monocytes Absolute: 0.4 10*3/uL (ref 0.1–0.9)
Monocytes: 5 %
Neutrophils Absolute: 6.7 10*3/uL (ref 1.4–7.0)
Neutrophils: 76 %
Platelets: 234 10*3/uL (ref 150–450)
RBC: 4.55 x10E6/uL (ref 3.77–5.28)
RDW: 11.9 % (ref 11.7–15.4)
WBC: 8.8 10*3/uL (ref 3.4–10.8)

## 2023-07-08 LAB — THYROID PANEL WITH TSH
Free Thyroxine Index: 1.8 (ref 1.2–4.9)
T3 Uptake Ratio: 23 % — ABNORMAL LOW (ref 24–39)
T4, Total: 7.7 ug/dL (ref 4.5–12.0)
TSH: 4.88 u[IU]/mL — ABNORMAL HIGH (ref 0.450–4.500)

## 2023-07-09 ENCOUNTER — Encounter: Payer: Self-pay | Admitting: Family Medicine

## 2023-07-10 LAB — NOVEL CORONAVIRUS, NAA: SARS-CoV-2, NAA: NOT DETECTED

## 2023-07-18 ENCOUNTER — Telehealth: Payer: Self-pay

## 2023-07-18 NOTE — Telephone Encounter (Signed)
The patient called in to check on her referral and schedule and appointment. I advised the patient I will have to call her back.

## 2023-07-30 ENCOUNTER — Emergency Department
Admission: EM | Admit: 2023-07-30 | Discharge: 2023-07-30 | Disposition: A | Discharge status: Home / Self Care | Payer: No Typology Code available for payment source | Attending: Emergency Medicine | Admitting: Emergency Medicine

## 2023-07-30 ENCOUNTER — Encounter: Payer: Self-pay | Admitting: Emergency Medicine

## 2023-07-30 ENCOUNTER — Other Ambulatory Visit: Payer: Self-pay

## 2023-07-30 DIAGNOSIS — T310 Burns involving less than 10% of body surface: Secondary | ICD-10-CM | POA: Insufficient documentation

## 2023-07-30 DIAGNOSIS — T22292A Burn of second degree of multiple sites of left shoulder and upper limb, except wrist and hand, initial encounter: Secondary | ICD-10-CM | POA: Insufficient documentation

## 2023-07-30 DIAGNOSIS — Y92511 Restaurant or cafe as the place of occurrence of the external cause: Secondary | ICD-10-CM | POA: Diagnosis not present

## 2023-07-30 DIAGNOSIS — X12XXXA Contact with other hot fluids, initial encounter: Secondary | ICD-10-CM | POA: Diagnosis not present

## 2023-07-30 DIAGNOSIS — T300 Burn of unspecified body region, unspecified degree: Secondary | ICD-10-CM

## 2023-07-30 MED ORDER — BACITRACIN ZINC 500 UNIT/GM EX OINT: TOPICAL_OINTMENT | Freq: Two times a day (BID) | CUTANEOUS | 0 refills | Status: DC

## 2023-07-30 MED ORDER — BACITRACIN ZINC 500 UNIT/GM EX OINT
TOPICAL_OINTMENT | Freq: Once | CUTANEOUS | Status: AC
  Filled 2023-07-30: qty 2.7

## 2023-07-30 NOTE — Discharge Instructions (Signed)
 Keep the wound clean, dry, and covered.  Avoid alcohol or peroxide.  Use mild soap and water to cleanse daily.  Change the dressing daily as needed.  Apply antibiotic ointment to help promote healing.  Leave any unruptured blisters intact.  Take OTC Tylenol or Motrin as needed for pain.  Follow-up with the urgent care provider approved by your employer.

## 2023-07-30 NOTE — ED Notes (Signed)
 See triage note  Presents with burn to left upper arm   States hot water spilled on arm  from work

## 2023-07-30 NOTE — ED Provider Notes (Signed)
 [facility] Emergency Department Provider Note     Event Date/Time   First MD Initiated Contact with Patient 07/30/23 1540     (approximate)   History   Burn   HPI  Katrina Long is a 43 y.o. female with a noncontributory medical history, presents to the ED for evaluation of a burn.  Patient reports from a [employer], where water from a coffee pot, wrapped by another employee splashed onto her upper arm.  She presents to the ED for evaluation of her injury.  Physical Exam   Triage Vital Signs: ED Triage Vitals  Encounter Vitals Group     BP 07/30/23 1340 112/82     Systolic BP Percentile --      Diastolic BP Percentile --      Pulse Rate 07/30/23 1340 97     Resp 07/30/23 1340 18     Temp 07/30/23 1340 98.9 F (37.2 C)     Temp Source 07/30/23 1340 Oral     SpO2 07/30/23 1340 97 %     Weight 07/30/23 1338 229 lb (103.9 kg)     Height 07/30/23 1338 5\' 5"  (1.651 m)     Head Circumference --      Peak Flow --      Pain Score 07/30/23 1338 5     Pain Loc --      Pain Education --      Exclude from Growth Chart --     Most recent vital signs: Vitals:   07/30/23 1340  BP: 112/82  Pulse: 97  Resp: 18  Temp: 98.9 F (37.2 C)  SpO2: 97%    General Awake, no distress. NAD HEENT NCAT. PERRL. EOMI. No rhinorrhea. Mucous membranes are moist.  CV:  Good peripheral perfusion. RRR RESP:  Normal effort. CTA ABD:  No distention.  SKIN:  L UE with a second-degree burn compassing less than 1% BSA noted to the dorsal outer upper arm.  No circumferential expansion is noted.  No blister formation noted.  Erythema is appreciated.   ED Results / Procedures / Treatments   Labs (all labs ordered are listed, but only abnormal results are displayed) Labs Reviewed - No data to display   EKG   RADIOLOGY   No results found.   PROCEDURES:  Critical Care performed: No  Procedures   MEDICATIONS ORDERED IN ED: Medications  bacitracin  ointment ( Topical Given by Other 07/30/23 1807)     IMPRESSION / MDM / ASSESSMENT AND PLAN / ED COURSE  I reviewed the triage vital signs and the nursing notes.                              Differential diagnosis includes, but is not limited to, thermal burn, contact dermatitis  Patient's presentation is most consistent with acute, uncomplicated illness.  Patient's diagnosis is consistent with 2nd degree thermal burn to the LUE. Patient will be discharged home with prescriptions for bacitracin ointment. Patient is to follow up with local urgent care or [facility] burn center as discussed, as needed or otherwise directed. Patient is given ED precautions to return to the ED for any worsening or new symptoms.   FINAL CLINICAL IMPRESSION(S) / ED DIAGNOSES   Final diagnoses:  Thermal burn  2nd degree burn multiple sites shoulder and arm except wrist and hand, left, initial encounter     Rx / DC Orders  ED Discharge Orders          Ordered    bacitracin ointment  2 times daily        07/30/23 1639             Note:  This document was prepared using Dragon voice recognition software and may include unintentional dictation errors.    Lissa Hoard, PA-C 07/31/23 2302    Janith Lima, MD 07/31/23 2329

## 2023-07-30 NOTE — ED Triage Notes (Addendum)
 Patient to ED via POV from work for a burn. Patient states hot water was poured on upper left arm. Arm red. Burn cream placed on it at work.   Arm wrapped by Myah, PA.

## 2023-07-30 NOTE — ED Provider Triage Note (Signed)
 Emergency Medicine Provider Triage Evaluation Note  Katrina Long , a 43 y.o. female  was evaluated in triage.  Pt complains of burn to left UE from a work related injury. Patient reports hot water was accidentally bumped into her arm. She placed burn ointment on the burn prior to ED arrival. UNK tetanus status   Review of Systems  Positive:  Negative:   Physical Exam  BP 112/82   Pulse 97   Temp 98.9 F (37.2 C) (Oral)   Resp 18   Ht 5\' 5"  (1.651 m)   Wt 103.9 kg   SpO2 97%   BMI 38.11 kg/m  Gen:   Awake, no distress   Resp:  Normal effort  MSK:   Moves extremities without difficulty  Other:  Non circumferential second degree partial burn to Left UE   Medical Decision Making  Medically screening exam initiated at 1:44 PM.  Appropriate orders placed.  Sirenia CHAQUITA BASQUES was informed that the remainder of the evaluation will be completed by another provider, this initial triage assessment does not replace that evaluation, and the importance of remaining in the ED until their evaluation is complete.  Cool wrap applied in triage   Kern Reap A, PA-C 07/30/23 1347

## 2023-08-04 ENCOUNTER — Encounter: Payer: Self-pay | Admitting: Family Medicine

## 2023-08-04 ENCOUNTER — Ambulatory Visit: Payer: Managed Care, Other (non HMO)

## 2023-08-04 DIAGNOSIS — Z3042 Encounter for surveillance of injectable contraceptive: Secondary | ICD-10-CM | POA: Diagnosis not present

## 2023-08-04 MED ORDER — MEDROXYPROGESTERONE ACETATE 150 MG/ML IM SUSP
150.0000 mg | Freq: Once | INTRAMUSCULAR | Status: AC
Start: 2023-08-04 — End: 2023-08-04
  Administered 2023-08-04: 150 mg via INTRAMUSCULAR

## 2023-08-11 ENCOUNTER — Ambulatory Visit: Payer: Self-pay

## 2023-09-18 ENCOUNTER — Ambulatory Visit (INDEPENDENT_AMBULATORY_CARE_PROVIDER_SITE_OTHER): Payer: Self-pay | Admitting: Family Medicine

## 2023-09-18 ENCOUNTER — Encounter: Payer: Self-pay | Admitting: Family Medicine

## 2023-09-18 VITALS — BP 98/67 | HR 80 | Temp 98.5°F | Ht 65.0 in | Wt 232.8 lb

## 2023-09-18 DIAGNOSIS — R079 Chest pain, unspecified: Secondary | ICD-10-CM

## 2023-09-18 DIAGNOSIS — Z0000 Encounter for general adult medical examination without abnormal findings: Secondary | ICD-10-CM

## 2023-09-18 DIAGNOSIS — Z1231 Encounter for screening mammogram for malignant neoplasm of breast: Secondary | ICD-10-CM

## 2023-09-18 DIAGNOSIS — E221 Hyperprolactinemia: Secondary | ICD-10-CM

## 2023-09-18 DIAGNOSIS — R631 Polydipsia: Secondary | ICD-10-CM | POA: Diagnosis not present

## 2023-09-18 DIAGNOSIS — E559 Vitamin D deficiency, unspecified: Secondary | ICD-10-CM | POA: Diagnosis not present

## 2023-09-18 LAB — BAYER DCA HB A1C WAIVED: HB A1C (BAYER DCA - WAIVED): 4.9 % (ref 4.8–5.6)

## 2023-09-18 NOTE — Assessment & Plan Note (Signed)
 Rechecking labs today. Await results. Treat as needed.

## 2023-09-18 NOTE — Patient Instructions (Signed)
 Please call to schedule your mammogram and/or bone density: [facility]  Address: [address], [place], [place] [address] Phone: [phone]  [facility] [address] [place],  [place]  [address] Phone: [phone]

## 2023-09-18 NOTE — Progress Notes (Signed)
 BP 98/67 (BP Location: Left Arm, Patient Position: Sitting, Cuff Size: Large)   Pulse 80   Temp 98.5 F (36.9 C) (Oral)   Ht 5\' 5"  (1.651 m)   Wt 232 lb 12.8 oz (105.6 kg)   SpO2 98%   BMI 38.74 kg/m    Subjective:    Patient ID: Katrina Long, female    DOB: 08-08-1980, 43 y.o.   MRN: [id number]  HPI: Katrina Long is a 43 y.o. female presenting on 09/18/2023 for comprehensive medical examination. Current medical complaints include:  CHEST PAIN Duration: in the last 2 weeks Onset: sudden Quality: sharp Severity: moderate-severe Location: left para substernal Radiation: none Episode duration: couple of minutes to 15 minutes Frequency: 2x in the past week Related to exertion: yes Activity when pain started: lifting not heavy boxes and sitting on her cough Trauma: no Anxiety/recent stressors: yes Status: stable Treatments attempted: nothing  Current pain status: pain free Shortness of breath: no Cough: no Nausea: no Diaphoresis: no Heartburn: yes Palpitations: no  Menopausal Symptoms: no  Depression Screen done today and results listed below:     09/18/2023    9:51 AM 07/07/2023    3:01 PM 01/27/2023    1:36 PM 09/16/2022    4:13 PM 06/16/2022    4:19 PM  Depression screen PHQ 2/9  Decreased Interest 1 1 1 1 1   Down, Depressed, Hopeless 1 1 1 1 1   PHQ - 2 Score 2 2 2 2 2   Altered sleeping 1 0 1 0 0  Tired, decreased energy 2 1 1 1 1   Change in appetite 0 0 0 0 0  Feeling bad or failure about yourself  0  0 0 0  Trouble concentrating 1 1 0 0 0  Moving slowly or fidgety/restless 0 0 0 0 0  Suicidal thoughts 0 0 0 0 0  PHQ-9 Score 6 4 4 3 3   Difficult doing work/chores Somewhat difficult Somewhat difficult Not difficult at all Not difficult at all Not difficult at all    Past Medical History:  Past Medical History:  Diagnosis Date   Allergy    Anemia    History of abnormal cervical Pap smear    Hypoglobulinemia    Hypoproteinemia (HCC)     Surgical History:   History reviewed. No pertinent surgical history.  Medications:  Current Outpatient Medications on File Prior to Visit  Medication Sig   albuterol (VENTOLIN HFA) 108 (90 Base) MCG/ACT inhaler Inhale 2 puffs into the lungs every 6 (six) hours as needed for wheezing or shortness of breath.   bacitracin ointment Apply topically 2 (two) times daily. Apply to affected area daily   cetirizine (ZYRTEC) 10 MG tablet Take 1 tablet (10 mg total) by mouth at bedtime.   ketotifen (ZADITOR) 0.025 % ophthalmic solution INSTILL 1 DROP INTO BOTH EYES EVERY DAY   montelukast (SINGULAIR) 10 MG tablet Take 1 tablet (10 mg total) by mouth at bedtime.   omeprazole (PRILOSEC) 20 MG capsule Take 1 capsule (20 mg total) by mouth daily.   ondansetron (ZOFRAN-ODT) 4 MG disintegrating tablet DISSOLVE 1 TABLET(4 MG) ON THE TONGUE EVERY 8 HOURS AS NEEDED FOR NAUSEA OR VOMITING   SUMAtriptan (IMITREX) 100 MG tablet Take 0.5-1 tablets (50-100 mg total) by mouth every 2 (two) hours as needed for migraine. May repeat in 2 hours if headache persists or recurs.   Vitamin D, Ergocalciferol, (DRISDOL) 1.25 MG (50000 UNIT) CAPS capsule TAKE 1 CAPSULE BY MOUTH EVERY 7  DAYS   No current facility-administered medications on file prior to visit.    Allergies:  Allergies  Allergen Reactions   Amoxicillin Rash   Entex Lq [Phenylephrine-Guaifenesin] Rash   Penicillins Rash    Social History:  Social History   Socioeconomic History   Marital status: Single    Spouse name: Not on file   Number of children: Not on file   Years of education: Not on file   Highest education level: Not on file  Occupational History   Not on file  Tobacco Use   Smoking status: Never   Smokeless tobacco: Never  Vaping Use   Vaping status: Never Used  Substance and Sexual Activity   Alcohol use: No   Drug use: No   Sexual activity: Yes  Other Topics Concern   Not on file  Social History Narrative   Not on file   Social Drivers of  Health   Financial Resource Strain: Low Risk  (09/18/2023)   Overall Financial Resource Strain (CARDIA)    Difficulty of Paying Living Expenses: Not hard at all  Food Insecurity: No Food Insecurity (09/18/2023)   Hunger Vital Sign    Worried About Running Out of Food in the Last Year: Never true    Ran Out of Food in the Last Year: Never true  Transportation Needs: No Transportation Needs (09/18/2023)   PRAPARE - Administrator, Civil Service (Medical): No    Lack of Transportation (Non-Medical): No  Physical Activity: Inactive (09/18/2023)   Exercise Vital Sign    Days of Exercise per Week: 0 days    Minutes of Exercise per Session: 0 min  Stress: No Stress Concern Present (09/18/2023)   [employer] of Occupational Health - Occupational Stress Questionnaire    Feeling of Stress : Not at all  Social Connections: Socially Isolated (09/18/2023)   Social Connection and Isolation Panel [NHANES]    Frequency of Communication with Friends and Family: More than three times a week    Frequency of Social Gatherings with Friends and Family: Once a week    Attends Religious Services: Never    Database administrator or Organizations: No    Attends Banker Meetings: Never    Marital Status: Never married  Intimate Partner Violence: Not At Risk (09/18/2023)   Humiliation, Afraid, Rape, and Kick questionnaire    Fear of Current or Ex-Partner: No    Emotionally Abused: No    Physically Abused: No    Sexually Abused: No   Social History   Tobacco Use  Smoking Status Never  Smokeless Tobacco Never   Social History   Substance and Sexual Activity  Alcohol Use No    Family History:  Family History  Problem Relation Age of Onset   Mental illness Mother    Lung disease Mother    Diabetes Sister    Cancer Daughter    Breast cancer Maternal Aunt    Cancer Maternal Aunt 53       Breast   Hypertension Maternal Grandmother    Mental illness Brother     Past medical  history, surgical history, medications, allergies, family history and social history reviewed with patient today and changes made to appropriate areas of the chart.   Review of Systems  Constitutional:  Positive for diaphoresis. Negative for chills, fever, malaise/fatigue and weight loss.  HENT: Negative.    Eyes:  Positive for blurred vision. Negative for double vision, photophobia, pain, discharge and redness.  Respiratory: Negative.    Cardiovascular:  Positive for chest pain. Negative for palpitations, orthopnea, claudication, leg swelling and PND.  Gastrointestinal:  Positive for blood in stool and heartburn. Negative for abdominal pain, constipation, diarrhea, melena, nausea and vomiting.  Genitourinary: Negative.   Musculoskeletal:  Positive for myalgias. Negative for back pain, falls, joint pain and neck pain.  Skin: Negative.   Neurological: Negative.   Endo/Heme/Allergies:  Positive for environmental allergies and polydipsia. Does not bruise/bleed easily.  Psychiatric/Behavioral:  Negative for depression, hallucinations, memory loss, substance abuse and suicidal ideas. The patient is nervous/anxious. The patient does not have insomnia.    All other ROS negative except what is listed above and in the HPI.      Objective:    BP 98/67 (BP Location: Left Arm, Patient Position: Sitting, Cuff Size: Large)   Pulse 80   Temp 98.5 F (36.9 C) (Oral)   Ht 5\' 5"  (1.651 m)   Wt 232 lb 12.8 oz (105.6 kg)   SpO2 98%   BMI 38.74 kg/m   Wt Readings from Last 3 Encounters:  09/18/23 232 lb 12.8 oz (105.6 kg)  07/30/23 229 lb (103.9 kg)  07/07/23 231 lb 9.6 oz (105.1 kg)    Physical Exam Vitals and nursing note reviewed.  Constitutional:      General: She is not in acute distress.    Appearance: Normal appearance. She is not ill-appearing, toxic-appearing or diaphoretic.  HENT:     Head: Normocephalic and atraumatic.     Right Ear: Tympanic membrane, ear canal and external ear  normal. There is no impacted cerumen.     Left Ear: Tympanic membrane, ear canal and external ear normal. There is no impacted cerumen.     Nose: Nose normal. No congestion or rhinorrhea.     Mouth/Throat:     Mouth: Mucous membranes are moist.     Pharynx: Oropharynx is clear. No oropharyngeal exudate or posterior oropharyngeal erythema.  Eyes:     General: No scleral icterus.       Right eye: No discharge.        Left eye: No discharge.     Extraocular Movements: Extraocular movements intact.     Conjunctiva/sclera: Conjunctivae normal.     Pupils: Pupils are equal, round, and reactive to light.  Neck:     Vascular: No carotid bruit.  Cardiovascular:     Rate and Rhythm: Normal rate and regular rhythm.     Pulses: Normal pulses.     Heart sounds: No murmur heard.    No friction rub. No gallop.  Pulmonary:     Effort: Pulmonary effort is normal. No respiratory distress.     Breath sounds: Normal breath sounds. No stridor. No wheezing, rhonchi or rales.  Chest:     Chest wall: No tenderness.  Abdominal:     General: Abdomen is flat. Bowel sounds are normal. There is no distension.     Palpations: Abdomen is soft. There is no mass.     Tenderness: There is no abdominal tenderness. There is no right CVA tenderness, left CVA tenderness, guarding or rebound.     Hernia: No hernia is present.  Genitourinary:    Comments: Breast and pelvic exams deferred with shared decision making Musculoskeletal:        General: No swelling, tenderness (on palpation of chest), deformity or signs of injury. Normal range of motion.     Cervical back: Normal range of motion and neck supple. No rigidity. No  muscular tenderness.     Right lower leg: No edema.     Left lower leg: No edema.  Lymphadenopathy:     Cervical: No cervical adenopathy.  Skin:    General: Skin is warm and dry.     Capillary Refill: Capillary refill takes less than 2 seconds.     Coloration: Skin is not jaundiced or pale.      Findings: No bruising, erythema, lesion or rash.  Neurological:     General: No focal deficit present.     Mental Status: She is alert and oriented to person, place, and time. Mental status is at baseline.     Cranial Nerves: No cranial nerve deficit.     Sensory: No sensory deficit.     Motor: No weakness.     Coordination: Coordination normal.     Gait: Gait normal.     Deep Tendon Reflexes: Reflexes normal.  Psychiatric:        Mood and Affect: Mood normal.        Behavior: Behavior normal.        Thought Content: Thought content normal.        Judgment: Judgment normal.     Results for orders placed or performed in visit on 07/07/23  Thyroid Panel With TSH   Collection Time: 07/07/23  3:12 PM  Result Value Ref Range   TSH 4.880 (H) 0.450 - 4.500 uIU/mL   T4, Total 7.7 4.5 - 12.0 ug/dL   T3 Uptake Ratio 23 (L) 24 - 39 %   Free Thyroxine Index 1.8 1.2 - 4.9  CBC with Differential/Platelet   Collection Time: 07/07/23  3:12 PM  Result Value Ref Range   WBC 8.8 3.4 - 10.8 x10E3/uL   RBC 4.55 3.77 - 5.28 x10E6/uL   Hemoglobin 13.8 11.1 - 15.9 g/dL   Hematocrit 16.1 09.6 - 46.6 %   MCV 91 79 - 97 fL   MCH 30.3 26.6 - 33.0 pg   MCHC 33.3 31.5 - 35.7 g/dL   RDW 04.5 40.9 - 81.1 %   Platelets 234 150 - 450 x10E3/uL   Neutrophils 76 Not Estab. %   Lymphs 16 Not Estab. %   Monocytes 5 Not Estab. %   Eos 1 Not Estab. %   Basos 1 Not Estab. %   Neutrophils Absolute 6.7 1.4 - 7.0 x10E3/uL   Lymphocytes Absolute 1.4 0.7 - 3.1 x10E3/uL   Monocytes Absolute 0.4 0.1 - 0.9 x10E3/uL   EOS (ABSOLUTE) 0.1 0.0 - 0.4 x10E3/uL   Basophils Absolute 0.1 0.0 - 0.2 x10E3/uL   Immature Granulocytes 1 Not Estab. %   Immature Grans (Abs) 0.1 0.0 - 0.1 x10E3/uL  Novel Coronavirus, NAA ([facility])   Collection Time: 07/07/23  3:17 PM   Specimen: Nasopharyngeal(NP) swabs in vial transport medium  Result Value Ref Range   SARS-CoV-2, NAA Not Detected Not Detected      Assessment & Plan:    Problem List Items Addressed This Visit       Endocrine   Hyperprolactinemia (HCC)   Rechecking labs today. Await results. Treat as needed.       Relevant Orders   Prolactin     Other   Vitamin D deficiency   Rechecking labs today. Await results. Treat as needed.       Relevant Orders   VITAMIN D 25 Hydroxy (Vit-D Deficiency, Fractures)   Other Visit Diagnoses       Routine general medical examination at a health care  facility    -  Primary   Vaccines up to date/declined. Screening labs checked today. Pap up to date. Mammo ordered. Continue diet and exercise. Call with any concerns.   Relevant Orders   CBC with Differential/Platelet   Comprehensive metabolic panel with GFR   Lipid Panel w/o Chol/HDL Ratio   TSH     Chest pain, unspecified type       EKG normal. ?muscular. Will try stretches- call if happens again. Continue to mointor.   Relevant Orders   EKG 12-Lead     Polydipsia       A1c checked today. Await results.   Relevant Orders   Bayer DCA Hb A1c Waived     Encounter for screening mammogram for malignant neoplasm of breast       Mammo ordered today.   Relevant Orders   MM 3D SCREENING MAMMOGRAM BILATERAL BREAST        Follow up plan: Return in about 1 year (around 09/17/2024) for physical.   LABORATORY TESTING:  - Pap smear: up to date  IMMUNIZATIONS:   - Tdap: Tetanus vaccination status reviewed: last tetanus booster within 10 years. - Influenza: Up to date - Pneumovax: Not applicable - Prevnar: Not applicable - COVID: Refused - HPV: Not applicable - Shingrix vaccine: Not applicable  SCREENING: -Mammogram: Ordered today   PATIENT COUNSELING:   Advised to take 1 mg of folate supplement per day if capable of pregnancy.   Sexuality: Discussed sexually transmitted diseases, partner selection, use of condoms, avoidance of unintended pregnancy  and contraceptive alternatives.   Advised to avoid cigarette smoking.  I discussed with the  patient that most people either abstain from alcohol or drink within safe limits (<=14/week and <=4 drinks/occasion for males, <=7/weeks and <= 3 drinks/occasion for females) and that the risk for alcohol disorders and other health effects rises proportionally with the number of drinks per week and how often a drinker exceeds daily limits.  Discussed cessation/primary prevention of drug use and availability of treatment for abuse.   Diet: Encouraged to adjust caloric intake to maintain  or achieve ideal body weight, to reduce intake of dietary saturated fat and total fat, to limit sodium intake by avoiding high sodium foods and not adding table salt, and to maintain adequate dietary potassium and calcium preferably from fresh fruits, vegetables, and low-fat dairy products.    stressed the importance of regular exercise  Injury prevention: Discussed safety belts, safety helmets, smoke detector, smoking near bedding or upholstery.   Dental health: Discussed importance of regular tooth brushing, flossing, and dental visits.    NEXT PREVENTATIVE PHYSICAL DUE IN 1 YEAR. Return in about 1 year (around 09/17/2024) for physical.

## 2023-09-19 ENCOUNTER — Encounter: Payer: Self-pay | Admitting: Family Medicine

## 2023-09-19 ENCOUNTER — Other Ambulatory Visit: Payer: Self-pay | Admitting: Family Medicine

## 2023-09-19 LAB — LIPID PANEL W/O CHOL/HDL RATIO
Cholesterol, Total: 166 mg/dL (ref 100–199)
HDL: 37 mg/dL — ABNORMAL LOW (ref >39)
LDL Chol Calc (NIH): 104 mg/dL — ABNORMAL HIGH (ref 0–99)
Triglycerides: 141 mg/dL (ref 0–149)
VLDL Cholesterol Cal: 25 mg/dL (ref 5–40)

## 2023-09-19 LAB — CBC WITH DIFFERENTIAL/PLATELET
Basophils Absolute: 0.1 10*3/uL (ref 0.0–0.2)
Basos: 1 %
EOS (ABSOLUTE): 0.1 10*3/uL (ref 0.0–0.4)
Eos: 1 %
Hematocrit: 41.6 % (ref 34.0–46.6)
Hemoglobin: 13.5 g/dL (ref 11.1–15.9)
Immature Grans (Abs): 0.1 10*3/uL (ref 0.0–0.1)
Immature Granulocytes: 1 %
Lymphocytes Absolute: 2.2 10*3/uL (ref 0.7–3.1)
Lymphs: 25 %
MCH: 30.1 pg (ref 26.6–33.0)
MCHC: 32.5 g/dL (ref 31.5–35.7)
MCV: 93 fL (ref 79–97)
Monocytes Absolute: 0.5 10*3/uL (ref 0.1–0.9)
Monocytes: 5 %
Neutrophils Absolute: 6.1 10*3/uL (ref 1.4–7.0)
Neutrophils: 67 %
Platelets: 238 10*3/uL (ref 150–450)
RBC: 4.48 x10E6/uL (ref 3.77–5.28)
RDW: 12 % (ref 11.7–15.4)
WBC: 8.9 10*3/uL (ref 3.4–10.8)

## 2023-09-19 LAB — TSH: TSH: 4.28 u[IU]/mL (ref 0.450–4.500)

## 2023-09-19 LAB — COMPREHENSIVE METABOLIC PANEL WITH GFR
ALT: 18 IU/L (ref 0–32)
AST: 21 IU/L (ref 0–40)
Albumin: 3.9 g/dL (ref 3.9–4.9)
Alkaline Phosphatase: 63 IU/L (ref 44–121)
BUN/Creatinine Ratio: 9 (ref 9–23)
BUN: 7 mg/dL (ref 6–24)
Bilirubin Total: 0.5 mg/dL (ref 0.0–1.2)
CO2: 22 mmol/L (ref 20–29)
Calcium: 8.9 mg/dL (ref 8.7–10.2)
Chloride: 106 mmol/L (ref 96–106)
Creatinine, Ser: 0.77 mg/dL (ref 0.57–1.00)
Globulin, Total: 1.6 g/dL (ref 1.5–4.5)
Glucose: 85 mg/dL (ref 70–99)
Potassium: 4.3 mmol/L (ref 3.5–5.2)
Sodium: 140 mmol/L (ref 134–144)
Total Protein: 5.5 g/dL — ABNORMAL LOW (ref 6.0–8.5)
eGFR: 98 mL/min/{1.73_m2} (ref >59)

## 2023-09-19 LAB — PROLACTIN: Prolactin: 8.5 ng/mL (ref 4.8–33.4)

## 2023-09-19 LAB — VITAMIN D 25 HYDROXY (VIT D DEFICIENCY, FRACTURES): Vit D, 25-Hydroxy: 21.1 ng/mL — ABNORMAL LOW (ref 30.0–100.0)

## 2023-09-19 MED ORDER — VITAMIN D (ERGOCALCIFEROL) 1.25 MG (50000 UNIT) PO CAPS: 50000.0000 [IU] | ORAL_CAPSULE | ORAL | 1 refills | Status: DC

## 2023-09-20 ENCOUNTER — Other Ambulatory Visit: Payer: Self-pay | Admitting: Family Medicine

## 2023-09-20 NOTE — Telephone Encounter (Signed)
 Vitamin D, Ergocalciferol, (DRISDOL) 1.25 MG (50000 UNIT) CAPS capsule 12 capsule 1 09/19/2023 --   Sig - Route: Take 1 capsule (50,000 Units total) by mouth every 7 (seven) days. - Oral   Sent to pharmacy as: Vitamin D, Ergocalciferol, (DRISDOL) 1.25 MG (50000 UNIT) Cap capsule   E-Prescribing Status: Receipt confirmed by pharmacy (09/19/2023 12:52 PM EDT)    Requested Prescriptions  Pending Prescriptions Disp Refills   Vitamin D, Ergocalciferol, (DRISDOL) 1.25 MG (50000 UNIT) CAPS capsule [Pharmacy Med Name: VITAMIN D2 50,000IU (ERGO) CAP RX] 12 capsule 1    Sig: TAKE 1 CAPSULE BY MOUTH EVERY 7 DAYS     Endocrinology:  Vitamins - Vitamin D Supplementation 2 Failed - 09/20/2023  5:50 PM      Failed - Manual Review: Route requests for 50,000 IU strength to the provider      Failed - Vitamin D in normal range and within 360 days    Vit D, 25-Hydroxy  Date Value Ref Range Status  09/18/2023 21.1 (L) 30.0 - 100.0 ng/mL Final    Comment:    Vitamin D deficiency has been defined by the Institute of Medicine and an Endocrine Society practice guideline as a level of serum 25-OH vitamin D less than 20 ng/mL (1,2). The Endocrine Society went on to further define vitamin D insufficiency as a level between 21 and 29 ng/mL (2). 1. IOM (Institute of Medicine). 2010. Dietary reference    intakes for calcium and D. [place]: The    [employer]. 2. Holick MF, [place], Bischoff-Ferrari HA, et al.    Evaluation, treatment, and prevention of vitamin D    deficiency: an Endocrine Society clinical practice    guideline. JCEM. 2011 Jul; 96(7):1911-30.          Passed - Ca in normal range and within 360 days    Calcium  Date Value Ref Range Status  09/18/2023 8.9 8.7 - 10.2 mg/dL Final   Calcium, Total  Date Value Ref Range Status  01/27/2014 8.8 8.5 - 10.1 mg/dL Final         Passed - Valid encounter within last 12 months    Recent Outpatient Visits           2 days ago  Routine general medical examination at a health care facility   [facility] [place], Megan P, DO

## 2023-10-01 ENCOUNTER — Other Ambulatory Visit: Payer: Self-pay | Admitting: Family Medicine

## 2023-10-02 ENCOUNTER — Other Ambulatory Visit: Payer: Self-pay

## 2023-10-02 ENCOUNTER — Encounter: Payer: Self-pay | Admitting: Gastroenterology

## 2023-10-02 ENCOUNTER — Ambulatory Visit (INDEPENDENT_AMBULATORY_CARE_PROVIDER_SITE_OTHER): Admitting: Gastroenterology

## 2023-10-02 VITALS — BP 114/72 | HR 84 | Temp 98.7°F | Ht 65.0 in | Wt 233.2 lb

## 2023-10-02 DIAGNOSIS — K625 Hemorrhage of anus and rectum: Secondary | ICD-10-CM

## 2023-10-02 DIAGNOSIS — Z8379 Family history of other diseases of the digestive system: Secondary | ICD-10-CM | POA: Diagnosis not present

## 2023-10-02 MED ORDER — NA SULFATE-K SULFATE-MG SULF 17.5-3.13-1.6 GM/177ML PO SOLN
354.0000 mL | Freq: Once | ORAL | 0 refills | Status: AC
Start: 2023-10-02 — End: 2023-10-02

## 2023-10-02 NOTE — Telephone Encounter (Signed)
 Requested Prescriptions  Pending Prescriptions Disp Refills   montelukast  (SINGULAIR ) 10 MG tablet [Pharmacy Med Name: MONTELUKAST  10MG  TABLETS] 90 tablet 3    Sig: TAKE 1 TABLET(10 MG) BY MOUTH AT BEDTIME     Pulmonology:  Leukotriene Inhibitors Passed - 10/02/2023  1:49 PM      Passed - Valid encounter within last 12 months    Recent Outpatient Visits           2 weeks ago Routine general medical examination at a health care facility   [facility], Megan P, DO               omeprazole  (PRILOSEC) 20 MG capsule [Pharmacy Med Name: OMEPRAZOLE  20MG  CAPSULES] 90 capsule 3    Sig: TAKE 1 CAPSULE(20 MG) BY MOUTH DAILY     Gastroenterology: Proton Pump Inhibitors Passed - 10/02/2023  1:49 PM      Passed - Valid encounter within last 12 months    Recent Outpatient Visits           2 weeks ago Routine general medical examination at a health care facility   [facility], [place] P, DO

## 2023-10-02 NOTE — Progress Notes (Signed)
 Katrina Oz, MD [address]  [address]  [place], [place] [address]  Main: [phone]  Fax: [phone]    Gastroenterology Consultation  Referring Provider:     Solomon Dupre, DO Primary Care Physician:  Solomon Dupre, DO Primary Gastroenterologist:  Dr. Karma Long Reason for Consultation: Painless rectal bleeding        HPI:   Katrina Long is a 43 y.o. female referred by Dr. Lincoln Renshaw, Jerilee Montane, DO  for consultation & management of painless rectal bleeding.  She noticed bright red blood per rectum weight loss not associated with bowel movement, has been intermittent, most severe in December which lasted for 5 days.  Since then, had 2 other episodes which were mild.  Reports occasional constipation.  Denies any rectal pain, pressure or discomfort.  Denies any abdominal pain, bloating, diarrhea.  No evidence of anemia  She does not smoke or drink alcohol Works in lab  NSAIDs: None  Antiplts/Anticoagulants/Anti thrombotics: None  GI Procedures: None  Past Medical History:  Diagnosis Date   Allergy    Anemia    History of abnormal cervical Pap smear    Hypoglobulinemia    Hypoproteinemia (HCC)     History reviewed. No pertinent surgical history.   Current Outpatient Medications:    albuterol  (VENTOLIN  HFA) 108 (90 Base) MCG/ACT inhaler, Inhale 2 puffs into the lungs every 6 (six) hours as needed for wheezing or shortness of breath., Disp: 6.7 g, Rfl: 3   bacitracin  ointment, Apply topically 2 (two) times daily. Apply to affected area daily, Disp: 30 g, Rfl: 0   cetirizine  (ZYRTEC ) 10 MG tablet, Take 1 tablet (10 mg total) by mouth at bedtime., Disp: 90 tablet, Rfl: 3   ketotifen  (ZADITOR ) 0.025 % ophthalmic solution, INSTILL 1 DROP INTO BOTH EYES EVERY DAY, Disp: 5 mL, Rfl: 11   montelukast  (SINGULAIR ) 10 MG tablet, TAKE 1 TABLET(10 MG) BY MOUTH AT BEDTIME, Disp: 90 tablet, Rfl: 3   ondansetron  (ZOFRAN -ODT) 4 MG disintegrating tablet, DISSOLVE 1  TABLET(4 MG) ON THE TONGUE EVERY 8 HOURS AS NEEDED FOR NAUSEA OR VOMITING, Disp: 45 tablet, Rfl: 1   SUMAtriptan  (IMITREX ) 100 MG tablet, Take 0.5-1 tablets (50-100 mg total) by mouth every 2 (two) hours as needed for migraine. May repeat in 2 hours if headache persists or recurs., Disp: 9 tablet, Rfl: 12   Vitamin D , Ergocalciferol , (DRISDOL ) 1.25 MG (50000 UNIT) CAPS capsule, Take 1 capsule (50,000 Units total) by mouth every 7 (seven) days., Disp: 12 capsule, Rfl: 1   Na Sulfate-K Sulfate-Mg Sulfate concentrate (SUPREP) 17.5-3.13-1.6 GM/177ML SOLN, Take 1 kit (354 mLs total) by mouth once for 1 dose., Disp: 354 mL, Rfl: 0   omeprazole  (PRILOSEC) 20 MG capsule, TAKE 1 CAPSULE(20 MG) BY MOUTH DAILY (Patient not taking: Reported on 10/02/2023), Disp: 90 capsule, Rfl: 3   Family History  Problem Relation Age of Onset   Mental illness Mother    Lung disease Mother    Diabetes Sister    Cancer Daughter    Breast cancer Maternal Aunt    Cancer Maternal Aunt 66       Breast   Hypertension Maternal Grandmother    Mental illness Brother      Social History   Tobacco Use   Smoking status: Never   Smokeless tobacco: Never  Vaping Use   Vaping status: Never Used  Substance Use Topics   Alcohol use: No   Drug use: No  Allergies as of 10/02/2023 - Review Complete 10/02/2023  Allergen Reaction Noted   Amoxicillin Rash 02/28/2016   Entex lq [phenylephrine-guaifenesin] Rash 12/18/2014   Penicillins Rash 12/18/2014    Review of Systems:    All systems reviewed and negative except where noted in HPI.   Physical Exam:  BP 114/72 (BP Location: Left Arm, Patient Position: Sitting, Cuff Size: Large)   Pulse 84   Temp 98.7 F (37.1 C) (Oral)   Ht 5\' 5"  (1.651 m)   Wt 233 lb 4 Long (105.8 kg)   BMI 38.81 kg/m  No LMP recorded. Patient has had an injection.  General:   Alert,  Well-developed, well-nourished, pleasant and cooperative in NAD Head:  Normocephalic and atraumatic. Eyes:   Sclera clear, no icterus.   Conjunctiva pink. Ears:  Normal auditory acuity. Nose:  No deformity, discharge, or lesions. Mouth:  No deformity or lesions,oropharynx pink & moist. Neck:  Supple; no masses or thyromegaly. Lungs:  Respirations even and unlabored.  Clear throughout to auscultation.   No wheezes, crackles, or rhonchi. No acute distress. Heart:  Regular rate and rhythm; no murmurs, clicks, rubs, or gallops. Abdomen:  Normal bowel sounds. Soft, non-tender and non-distended without masses, hepatosplenomegaly or hernias noted.  No guarding or rebound tenderness.   Rectal: Not performed Msk:  Symmetrical without gross deformities. Good, equal movement & strength bilaterally. Pulses:  Normal pulses noted. Extremities:  No clubbing or edema.  No cyanosis. Neurologic:  Alert and oriented x3;  grossly normal neurologically. Skin:  Intact without significant lesions or rashes. No jaundice. Psych:  Alert and cooperative. Normal mood and affect.  Imaging Studies: No abdominal imaging  Assessment and Plan:   Katrina Long is a 43 y.o. female with painless rectal bleeding, bright red blood without any evidence of anemia Family history of Crohn's disease in first-degree relative Recommend colonoscopy for further evaluation, discussed about outpatient hemorrhoid ligation   Follow up based on the colonoscopy results   Katrina Oz, MD

## 2023-10-07 ENCOUNTER — Other Ambulatory Visit: Payer: Self-pay | Admitting: Family Medicine

## 2023-10-10 NOTE — Telephone Encounter (Signed)
 Requested Prescriptions  Pending Prescriptions Disp Refills   cetirizine  (ZYRTEC ) 10 MG tablet [Pharmacy Med Name: CETIRIZINE  10MG  TABLETS] 90 tablet 3    Sig: TAKE 1 TABLET(10 MG) BY MOUTH AT BEDTIME     Ear, Nose, and Throat:  Antihistamines 2 Passed - 10/10/2023  8:23 AM      Passed - Cr in normal range and within 360 days    Creatinine  Date Value Ref Range Status  01/27/2014 0.96 0.60 - 1.30 mg/dL Final   Creatinine, Ser  Date Value Ref Range Status  09/18/2023 0.77 0.57 - 1.00 mg/dL Final         Passed - Valid encounter within last 12 months    Recent Outpatient Visits           3 weeks ago Routine general medical examination at a health care facility   [facility], Magnet P, DO

## 2023-10-12 ENCOUNTER — Encounter: Payer: Self-pay | Admitting: Gastroenterology

## 2023-10-12 ENCOUNTER — Other Ambulatory Visit: Payer: Self-pay | Admitting: Family Medicine

## 2023-10-16 NOTE — Telephone Encounter (Signed)
 Requested medication (s) are due for refill today:   Provider to review Zofran   Requested medication (s) are on the active medication list:   Yes for both  Future visit scheduled:   Yes.  09/19/2024.   LOV was CPE on 09/18/2023   Last ordered: Imitrex  09/16/2022 #9, 12 refills;  Zofran  09/16/2022 #45, 1 refill  Non delegated refill   Requested Prescriptions  Pending Prescriptions Disp Refills   SUMAtriptan  (IMITREX ) 100 MG tablet [Pharmacy Med Name: SUMATRIPTAN  100MG  TABLETS] 9 tablet 12    Sig: TAKE 1/2 TO 1 TABLET(50 TO 100 MG) BY MOUTH EVERY 2 HOURS AS NEEDED FOR MIGRAINE. MAY REPEAT IN 2 HOURS IF HEADACHE PERSISTS OR RECURS     Neurology:  Migraine Therapy - Triptan Passed - 10/16/2023 12:45 PM      Passed - Last BP in normal range    BP Readings from Last 1 Encounters:  10/02/23 114/72         Passed - Valid encounter within last 12 months    Recent Outpatient Visits           4 weeks ago Routine general medical examination at a health care facility   [facility], Megan P, DO               ondansetron  (ZOFRAN -ODT) 4 MG disintegrating tablet [Pharmacy Med Name: ONDANSETRON  ODT 4MG  TABLETS] 45 tablet 1    Sig: DISSOLVE 1 TABLET(4 MG) ON THE TONGUE EVERY 8 HOURS AS NEEDED FOR NAUSEA OR VOMITING     Not Delegated - Gastroenterology: Antiemetics - ondansetron  Failed - 10/16/2023 12:45 PM      Failed - This refill cannot be delegated      Passed - AST in normal range and within 360 days    AST  Date Value Ref Range Status  09/18/2023 21 0 - 40 IU/L Final   SGOT(AST)  Date Value Ref Range Status  01/27/2014 19 15 - 37 Unit/L Final         Passed - ALT in normal range and within 360 days    ALT  Date Value Ref Range Status  09/18/2023 18 0 - 32 IU/L Final   SGPT (ALT)  Date Value Ref Range Status  01/27/2014 22 U/L Final    Comment:    14-63 NOTE: New Reference Range 12/31/13          Passed - Valid encounter within last 6 months     Recent Outpatient Visits           4 weeks ago Routine general medical examination at a health care facility   [facility] Lopez, [place], DO

## 2023-10-23 ENCOUNTER — Encounter: Payer: Self-pay | Admitting: Gastroenterology

## 2023-10-23 ENCOUNTER — Ambulatory Visit
Admission: RE | Admit: 2023-10-23 | Discharge: 2023-10-23 | Disposition: A | Discharge status: Home / Self Care | Attending: Gastroenterology | Admitting: Gastroenterology

## 2023-10-23 ENCOUNTER — Other Ambulatory Visit: Payer: Self-pay

## 2023-10-23 ENCOUNTER — Ambulatory Visit: Payer: Self-pay | Admitting: Anesthesiology

## 2023-10-23 ENCOUNTER — Encounter
Admission: RE | Disposition: A | Discharge status: Home / Self Care | Payer: Self-pay | Source: Home / Self Care | Attending: Gastroenterology

## 2023-10-23 DIAGNOSIS — D120 Benign neoplasm of cecum: Secondary | ICD-10-CM | POA: Insufficient documentation

## 2023-10-23 DIAGNOSIS — K625 Hemorrhage of anus and rectum: Secondary | ICD-10-CM | POA: Diagnosis present

## 2023-10-23 DIAGNOSIS — K635 Polyp of colon: Secondary | ICD-10-CM

## 2023-10-23 DIAGNOSIS — K6389 Other specified diseases of intestine: Secondary | ICD-10-CM | POA: Diagnosis not present

## 2023-10-23 DIAGNOSIS — D124 Benign neoplasm of descending colon: Secondary | ICD-10-CM | POA: Insufficient documentation

## 2023-10-23 DIAGNOSIS — K5730 Diverticulosis of large intestine without perforation or abscess without bleeding: Secondary | ICD-10-CM | POA: Insufficient documentation

## 2023-10-23 DIAGNOSIS — K621 Rectal polyp: Secondary | ICD-10-CM | POA: Diagnosis not present

## 2023-10-23 DIAGNOSIS — D125 Benign neoplasm of sigmoid colon: Secondary | ICD-10-CM | POA: Insufficient documentation

## 2023-10-23 DIAGNOSIS — K6289 Other specified diseases of anus and rectum: Secondary | ICD-10-CM

## 2023-10-23 HISTORY — PX: POLYPECTOMY: SHX149

## 2023-10-23 HISTORY — PX: COLONOSCOPY: SHX5424

## 2023-10-23 LAB — POCT PREGNANCY, URINE: Preg Test, Ur: NEGATIVE

## 2023-10-23 SURGERY — COLONOSCOPY
Anesthesia: General | Site: Rectum

## 2023-10-23 MED ORDER — PROPOFOL 10 MG/ML IV BOLUS
INTRAVENOUS | Status: AC
  Filled 2023-10-23: qty 40

## 2023-10-23 MED ORDER — STERILE WATER FOR IRRIGATION IR SOLN
Status: DC | PRN
  Administered 2023-10-23: 60 mL

## 2023-10-23 MED ORDER — STERILE WATER FOR IRRIGATION IR SOLN
Status: DC | PRN
  Administered 2023-10-23: 1000 mL

## 2023-10-23 MED ORDER — LIDOCAINE HCL (PF) 2 % IJ SOLN
INTRAMUSCULAR | Status: AC
  Filled 2023-10-23: qty 5

## 2023-10-23 MED ORDER — ONDANSETRON HCL 4 MG/2ML IJ SOLN
4.0000 mg | Freq: Once | INTRAMUSCULAR | Status: AC
  Administered 2023-10-23: 4 mg via INTRAVENOUS

## 2023-10-23 MED ORDER — LIDOCAINE HCL (CARDIAC) PF 100 MG/5ML IV SOSY
PREFILLED_SYRINGE | INTRAVENOUS | Status: DC | PRN
  Administered 2023-10-23: 50 mg via INTRAVENOUS

## 2023-10-23 MED ORDER — SODIUM CHLORIDE 0.9 % IV SOLN
INTRAVENOUS | Status: DC
Start: 2023-10-23 — End: 2023-10-23

## 2023-10-23 MED ORDER — PROPOFOL 10 MG/ML IV BOLUS
INTRAVENOUS | Status: AC
  Filled 2023-10-23: qty 20

## 2023-10-23 MED ORDER — PROPOFOL 10 MG/ML IV BOLUS
INTRAVENOUS | Status: DC | PRN
  Administered 2023-10-23 (×6): 40 mg via INTRAVENOUS
  Administered 2023-10-23: 30 mg via INTRAVENOUS
  Administered 2023-10-23: 90 mg via INTRAVENOUS
  Administered 2023-10-23: 30 mg via INTRAVENOUS
  Administered 2023-10-23: 40 mg via INTRAVENOUS

## 2023-10-23 MED ORDER — ONDANSETRON HCL 4 MG/2ML IJ SOLN
INTRAMUSCULAR | Status: AC
Start: 2023-10-23 — End: ?
  Filled 2023-10-23: qty 2

## 2023-10-23 SURGICAL SUPPLY — 16 items
CLIP HMST 235XBRD CATH ROT (MISCELLANEOUS) IMPLANT
CLIP HMST11XOPN 235X2.8X (MISCELLANEOUS) IMPLANT
ELECTRODE REM PT RTRN 9FT ADLT (ELECTROSURGICAL) IMPLANT
FORCEPS BIOP RAD 4 LRG CAP 4 (CUTTING FORCEPS) IMPLANT
GAUZE SPONGE 4X4 12PLY STRL (GAUZE/BANDAGES/DRESSINGS) IMPLANT
GOWN CVR UNV OPN BCK APRN NK (MISCELLANEOUS) ×4 IMPLANT
INJECTOR VARIJECT VIN23 (MISCELLANEOUS) IMPLANT
KIT DEFENDO VALVE AND CONN (KITS) IMPLANT
KIT PRC NS LF DISP ENDO (KITS) ×2 IMPLANT
MANIFOLD NEPTUNE II (INSTRUMENTS) ×2 IMPLANT
MARKER SPOT ENDO TATTOO 5ML (MISCELLANEOUS) IMPLANT
PROBE APC STR FIRE (PROBE) IMPLANT
RETRIEVER NET ROTH 2.5X230 LF (MISCELLANEOUS) IMPLANT
SNARE COLD EXACTO (MISCELLANEOUS) IMPLANT
SNARE LASSO HEX 3 IN 1 (INSTRUMENTS) IMPLANT
TRAP ETRAP POLY (MISCELLANEOUS) IMPLANT

## 2023-10-23 NOTE — Anesthesia Preprocedure Evaluation (Addendum)
 Anesthesia Evaluation  Patient identified by MRN, date of birth, ID band Patient awake    Reviewed: Allergy & Precautions, H&P , NPO status , Patient's Chart, lab work & pertinent test results  Airway Mallampati: II  TM Distance: >3 FB Neck ROM: Full    Dental no notable dental hx.    Pulmonary neg pulmonary ROS   Pulmonary exam normal breath sounds clear to auscultation       Cardiovascular negative cardio ROS Normal cardiovascular exam Rhythm:Regular Rate:Normal     Neuro/Psych  PSYCHIATRIC DISORDERS      negative neurological ROS  negative psych ROS   GI/Hepatic negative GI ROS, Neg liver ROS,,,  Endo/Other  negative endocrine ROS    Renal/GU negative Renal ROS  negative genitourinary   Musculoskeletal negative musculoskeletal ROS (+)    Abdominal   Peds negative pediatric ROS (+)  Hematology negative hematology ROS (+) Blood dyscrasia, anemia   Anesthesia Other Findings Anemia Migraines Vomiting with migraines---headache "from not eating" and vomited with headache  Reproductive/Obstetrics negative OB ROS                             Anesthesia Physical Anesthesia Plan  ASA: 1  Anesthesia Plan: General   Post-op Pain Management:    Induction: Intravenous  PONV Risk Score and Plan:   Airway Management Planned: Natural Airway and Nasal Cannula  Additional Equipment:   Intra-op Plan:   Post-operative Plan:   Informed Consent: I have reviewed the patients History and Physical, chart, labs and discussed the procedure including the risks, benefits and alternatives for the proposed anesthesia with the patient or authorized representative who has indicated his/her understanding and acceptance.     Dental Advisory Given  Plan Discussed with: Anesthesiologist, CRNA and Surgeon  Anesthesia Plan Comments: (Patient consented for risks of anesthesia including but not limited to:   - adverse reactions to medications - damage to eyes, teeth, lips or other oral mucosa - nerve damage due to positioning  - sore throat or hoarseness - Damage to heart, brain, nerves, lungs, other parts of body or loss of life  Patient voiced understanding and assent.)        Anesthesia Quick Evaluation

## 2023-10-23 NOTE — H&P (Signed)
 [name], MD [address]  [address]  [place], [place] [address]  Main: [phone]  Fax: [phone] Pager: [phone]  Primary Care Physician:  Solomon Dupre, DO Primary Gastroenterologist:  Dr. [name]  Pre-Procedure History & Physical: HPI:  Katrina Long is a 43 y.o. female is here for an colonoscopy.   Past Medical History:  Diagnosis Date   Allergy    Anemia    History of abnormal cervical Pap smear    Hypoglobulinemia    Hypoproteinemia (HCC)     History reviewed. No pertinent surgical history.  Prior to Admission medications   Medication Sig Start Date End Date Taking? Authorizing Provider  albuterol  (VENTOLIN  HFA) 108 (90 Base) MCG/ACT inhaler Inhale 2 puffs into the lungs every 6 (six) hours as needed for wheezing or shortness of breath. 09/16/22  Yes Johnson, Megan P, DO  cetirizine  (ZYRTEC ) 10 MG tablet TAKE 1 TABLET(10 MG) BY MOUTH AT BEDTIME 10/10/23  Yes Johnson, Megan P, DO  ketotifen  (ZADITOR ) 0.025 % ophthalmic solution INSTILL 1 DROP INTO BOTH EYES EVERY DAY 07/22/19  Yes Johnson, Megan P, DO  montelukast  (SINGULAIR ) 10 MG tablet TAKE 1 TABLET(10 MG) BY MOUTH AT BEDTIME 10/02/23  Yes Johnson, Megan P, DO  ondansetron  (ZOFRAN -ODT) 4 MG disintegrating tablet DISSOLVE 1 TABLET(4 MG) ON THE TONGUE EVERY 8 HOURS AS NEEDED FOR NAUSEA OR VOMITING 10/16/23  Yes Cannady, Jolene T, NP  SUMAtriptan  (IMITREX ) 100 MG tablet TAKE 1/2 TO 1 TABLET(50 TO 100 MG) BY MOUTH EVERY 2 HOURS AS NEEDED FOR MIGRAINE. MAY REPEAT IN 2 HOURS IF HEADACHE PERSISTS OR RECURS 10/16/23  Yes Cannady, Jolene T, NP  Vitamin D , Ergocalciferol , (DRISDOL ) 1.25 MG (50000 UNIT) CAPS capsule Take 1 capsule (50,000 Units total) by mouth every 7 (seven) days. 09/19/23  Yes Johnson, Megan P, DO  bacitracin  ointment Apply topically 2 (two) times daily. Apply to affected area daily Patient not taking: Reported on 10/12/2023 07/30/23   Menshew, Raye Cai, PA-C  omeprazole  (PRILOSEC) 20 MG capsule  TAKE 1 CAPSULE(20 MG) BY MOUTH DAILY Patient not taking: Reported on 10/02/2023 10/02/23   Terre Ferri P, DO    Allergies as of 10/02/2023 - Review Complete 10/02/2023  Allergen Reaction Noted   Amoxicillin Rash 02/28/2016   Entex lq [phenylephrine-guaifenesin] Rash 12/18/2014   Penicillins Rash 12/18/2014    Family History  Problem Relation Age of Onset   Mental illness Mother    Lung disease Mother    Diabetes Sister    Cancer Daughter    Breast cancer Maternal Aunt    Cancer Maternal Aunt 50       Breast   Hypertension Maternal Grandmother    Mental illness Brother     Social History   Socioeconomic History   Marital status: Single    Spouse name: Not on file   Number of children: Not on file   Years of education: Not on file   Highest education level: Not on file  Occupational History   Not on file  Tobacco Use   Smoking status: Never   Smokeless tobacco: Never  Vaping Use   Vaping status: Never Used  Substance and Sexual Activity   Alcohol use: No   Drug use: No   Sexual activity: Yes  Other Topics Concern   Not on file  Social History Narrative   Not on file   Social Drivers of Health   Financial Resource Strain: Low Risk  (09/18/2023)   Overall  Financial Resource Strain (CARDIA)    Difficulty of Paying Living Expenses: Not hard at all  Food Insecurity: No Food Insecurity (09/18/2023)   Hunger Vital Sign    Worried About Running Out of Food in the Last Year: Never true    Ran Out of Food in the Last Year: Never true  Transportation Needs: No Transportation Needs (09/18/2023)   PRAPARE - Administrator, Civil Service (Medical): No    Lack of Transportation (Non-Medical): No  Physical Activity: Inactive (09/18/2023)   Exercise Vital Sign    Days of Exercise per Week: 0 days    Minutes of Exercise per Session: 0 min  Stress: No Stress Concern Present (09/18/2023)   [employer] of Occupational Health - Occupational Stress Questionnaire     Feeling of Stress : Not at all  Social Connections: Socially Isolated (09/18/2023)   Social Connection and Isolation Panel [NHANES]    Frequency of Communication with Friends and Family: More than three times a week    Frequency of Social Gatherings with Friends and Family: Once a week    Attends Religious Services: Never    Database administrator or Organizations: No    Attends Banker Meetings: Never    Marital Status: Never married  Intimate Partner Violence: Not At Risk (09/18/2023)   Humiliation, Afraid, Rape, and Kick questionnaire    Fear of Current or Ex-Partner: No    Emotionally Abused: No    Physically Abused: No    Sexually Abused: No    Review of Systems: See HPI, otherwise negative ROS  Physical Exam: BP (!) 107/93   Pulse 79   Temp (!) 97.3 F (36.3 C) (Temporal)   Resp 15   Ht 5\' 5"  (1.651 m)   Wt 103 kg   SpO2 98%   Breastfeeding Unknown   BMI 37.79 kg/m  General:   Alert,  pleasant and cooperative in NAD Head:  Normocephalic and atraumatic. Neck:  Supple; no masses or thyromegaly. Lungs:  Clear throughout to auscultation.    Heart:  Regular rate and rhythm. Abdomen:  Soft, nontender and nondistended. Normal bowel sounds, without guarding, and without rebound.   Neurologic:  Alert and  oriented x4;  grossly normal neurologically.  Impression/Plan: TANE HIGHLAND is here for an colonoscopy to be performed for rectal bleeding  Risks, benefits, limitations, and alternatives regarding  colonoscopy have been reviewed with the patient.  Questions have been answered.  All parties agreeable.   Ellis Guys, MD  10/23/2023, 7:40 AM

## 2023-10-23 NOTE — Anesthesia Postprocedure Evaluation (Signed)
 Anesthesia Post Note  Patient: Katrina Long  Procedure(s) Performed: COLONOSCOPY (Rectum) POLYPECTOMY, INTESTINE (Rectum)  Patient location during evaluation: PACU Anesthesia Type: General Level of consciousness: awake and alert Pain management: pain level controlled Vital Signs Assessment: post-procedure vital signs reviewed and stable Respiratory status: spontaneous breathing, nonlabored ventilation, respiratory function stable and patient connected to nasal cannula oxygen Cardiovascular status: blood pressure returned to baseline and stable Postop Assessment: no apparent nausea or vomiting Anesthetic complications: no   No notable events documented.   Last Vitals:  Vitals:   10/23/23 0830 10/23/23 0833  BP: 100/75 100/78  Pulse: 77 72  Resp: (!) 24 12  Temp:  (!) 36.3 C  SpO2: 95% 100%    Last Pain:  Vitals:   10/23/23 0833  TempSrc:   PainSc: 0-No pain                 Emilie Harden

## 2023-10-23 NOTE — Transfer of Care (Signed)
 Immediate Anesthesia Transfer of Care Note  Patient: Katrina Long  Procedure(s) Performed: COLONOSCOPY (Rectum) POLYPECTOMY, INTESTINE (Rectum)  Patient Location: PACU  Anesthesia Type: General  Level of Consciousness: awake, alert  and patient cooperative  Airway and Oxygen Therapy: Patient Spontanous Breathing and Patient connected to supplemental oxygen  Post-op Assessment: Post-op Vital signs reviewed, Patient's Cardiovascular Status Stable, Respiratory Function Stable, Patent Airway and No signs of Nausea or vomiting  Post-op Vital Signs: Reviewed and stable  Complications: No notable events documented.

## 2023-10-23 NOTE — Op Note (Signed)
 [facility] Gastroenterology Patient Name: Katrina Long Procedure Date: 10/23/2023 7:25 AM MRN: [id number] Account #: [id number] Date of Birth: 01-17-1981 Admit Type: Outpatient Age: 43 Room: [facility] OR ROOM 01 Gender: Female Note Status: Finalized Instrument Name: [id number] Procedure:             Colonoscopy Indications:           This is the patient's first colonoscopy, Rectal                         bleeding Providers:             Selena Daily MD, MD Referring MD:          Solomon Dupre (Referring MD) Medicines:             General Anesthesia Complications:         No immediate complications. Estimated blood loss: None. Procedure:             Pre-Anesthesia Assessment:                        - Prior to the procedure, a History and Physical was                         performed, and patient medications and allergies were                         reviewed. The patient is competent. The risks and                         benefits of the procedure and the sedation options and                         risks were discussed with the patient. All questions                         were answered and informed consent was obtained.                         Patient identification and proposed procedure were                         verified by the physician, the nurse, the                         anesthesiologist, the anesthetist and the technician                         in the pre-procedure area in the procedure room in the                         endoscopy suite. Mental Status Examination: alert and                         oriented. Airway Examination: normal oropharyngeal                         airway and neck mobility. Respiratory Examination:  clear to auscultation. CV Examination: normal.                         Prophylactic Antibiotics: The patient does not require                         prophylactic antibiotics. Prior Anticoagulants: The                          patient has taken no anticoagulant or antiplatelet                         agents. ASA Grade Assessment: I - A normal, healthy                         patient. After reviewing the risks and benefits, the                         patient was deemed in satisfactory condition to                         undergo the procedure. The anesthesia plan was to use                         general anesthesia. Immediately prior to                         administration of medications, the patient was                         re-assessed for adequacy to receive sedatives. The                         heart rate, respiratory rate, oxygen saturations,                         blood pressure, adequacy of pulmonary ventilation, and                         response to care were monitored throughout the                         procedure. The physical status of the patient was                         re-assessed after the procedure.                        After obtaining informed consent, the colonoscope was                         passed under direct vision. Throughout the procedure,                         the patient's blood pressure, pulse, and oxygen                         saturations were monitored continuously. The  Colonoscope was introduced through the anus and                         advanced to the the terminal ileum, with                         identification of the appendiceal orifice and IC                         valve. The colonoscopy was performed without                         difficulty. The patient tolerated the procedure well.                         The quality of the bowel preparation was evaluated                         using the BBPS [facility] Bowel Preparation Scale) with                         scores of: Right Colon = 3, Transverse Colon = 3 and                         Left Colon = 3 (entire mucosa seen well with no                         residual  staining, small fragments of stool or opaque                         liquid). The total BBPS score equals 9. The terminal                         ileum, ileocecal valve, appendiceal orifice, and                         rectum were photographed. Findings:      The perianal and digital rectal examinations were normal. Pertinent       negatives include normal sphincter tone and no palpable rectal lesions.      The terminal ileum contained a single non-bleeding aphtha. No stigmata       of recent bleeding were seen.      A diminutive polyp was found in the ileocecal valve. The polyp was       sessile. The polyp was removed with a jumbo cold forceps. Resection and       retrieval were complete. Estimated blood loss: none.      A 5 mm polyp was found in the descending colon. The polyp was sessile.       The polyp was removed with a cold snare. Resection and retrieval were       complete. Estimated blood loss: none.      A 30 mm polyp was found in the sigmoid colon. The polyp was       pedunculated. The polyp was removed with a hot snare. Resection and       retrieval were complete. To prevent bleeding after the polypectomy, one       hemostatic clip was successfully  placed (MR safe). Clip manufacturer:       [employer]. There was no bleeding during, or at the end, of the       procedure.      Multiple diverticula were found in the recto-sigmoid colon and sigmoid       colon.      A diffuse area of mildly erythematous mucosa was found in the distal       rectum. Biopsies were taken with a cold forceps for histology. Estimated       blood loss: none.      The exam was otherwise without abnormality on direct and retroflexion       views. Impression:            - Aphtha in the terminal ileum.                        - One diminutive polyp at the ileocecal valve, removed                         with a jumbo cold forceps. Resected and retrieved.                        - One 5 mm polyp in the  descending colon, removed with                         a cold snare. Resected and retrieved.                        - One 30 mm polyp in the sigmoid colon, removed with a                         hot snare. Resected and retrieved. Clip manufacturer:                         [employer]. Clip (MR safe) was placed.                        - Diverticulosis in the recto-sigmoid colon and in the                         sigmoid colon.                        - Erythematous mucosa in the distal rectum. Biopsied.                        - The examination was otherwise normal on direct and                         retroflexion views. Recommendation:        - Discharge patient to home (with parent).                        - Resume previous diet today.                        - Continue present medications.                        -  Await pathology results.                        - Repeat colonoscopy in 3 years for surveillance based                         on pathology results. Procedure Code(s):     --- Professional ---                        [phone], Colonoscopy, flexible; with removal of                         tumor(s), polyp(s), or other lesion(s) by snare                         technique                        45380, 59, Colonoscopy, flexible; with biopsy, single                         or multiple Diagnosis Code(s):     --- Professional ---                        D12.0, Benign neoplasm of cecum                        K63.89, Other specified diseases of intestine                        D12.5, Benign neoplasm of sigmoid colon                        D12.4, Benign neoplasm of descending colon                        K62.89, Other specified diseases of anus and rectum                        K62.5, Hemorrhage of anus and rectum                        K57.30, Diverticulosis of large intestine without                         perforation or abscess without bleeding CPT copyright 2022 American Medical  Association. All rights reserved. The codes documented in this report are preliminary and upon coder review may  be revised to meet current compliance requirements. Dr. Evia Hof Selena Daily MD, MD 10/23/2023 8:20:13 AM This report has been signed electronically. Number of Addenda: 0 Note Initiated On: 10/23/2023 7:25 AM Scope Withdrawal Time: 0 hours 17 minutes 51 seconds  Total Procedure Duration: 0 hours 20 minutes 8 seconds  Estimated Blood Loss:  Estimated blood loss: none.      [facility]

## 2023-10-24 LAB — SURGICAL PATHOLOGY

## 2023-10-25 ENCOUNTER — Ambulatory Visit: Payer: Self-pay | Admitting: Gastroenterology

## 2023-10-27 ENCOUNTER — Ambulatory Visit: Payer: Managed Care, Other (non HMO)

## 2023-10-27 DIAGNOSIS — Z3042 Encounter for surveillance of injectable contraceptive: Secondary | ICD-10-CM | POA: Diagnosis not present

## 2023-10-27 MED ORDER — MEDROXYPROGESTERONE ACETATE 150 MG/ML IM SUSP
150.0000 mg | Freq: Once | INTRAMUSCULAR | Status: AC
  Administered 2023-10-27: 150 mg via INTRAMUSCULAR

## 2023-10-27 NOTE — Progress Notes (Signed)
 Patient is in office today for a nurse visit for Birth Control Injection. Patient Injection was given in the  Left deltoid. Patient tolerated injection well.

## 2023-10-27 NOTE — Addendum Note (Signed)
 Addended by: Ayah Cozzolino T on: 10/27/2023 03:58 PM   Modules accepted: Orders

## 2023-10-28 IMAGING — MG MM DIGITAL SCREENING BILAT W/ TOMO AND CAD
8 series · 8 of 24 positions shown · non-contrast
Comparison: None.

CLINICAL DATA: Screening.

EXAM:
DIGITAL SCREENING BILATERAL MAMMOGRAM WITH TOMOSYNTHESIS AND CAD
TECHNIQUE: Bilateral screening digital craniocaudal and mediolateral oblique
mammograms were obtained. Bilateral screening digital breast
tomosynthesis was performed. The images were evaluated with
computer-aided detection.

[L MLO synth-2D]
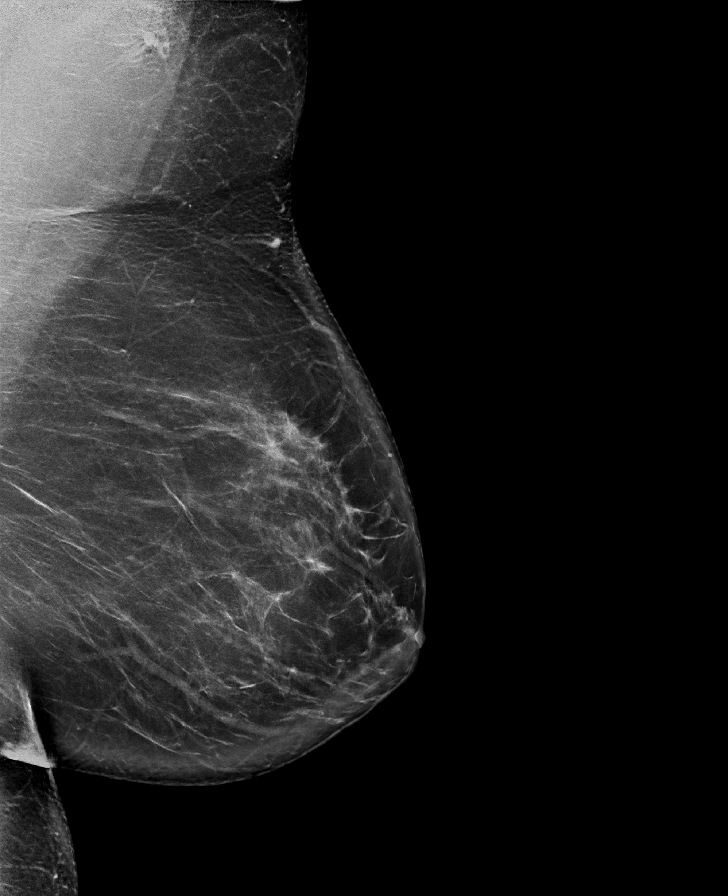

[L CC synth-2D]
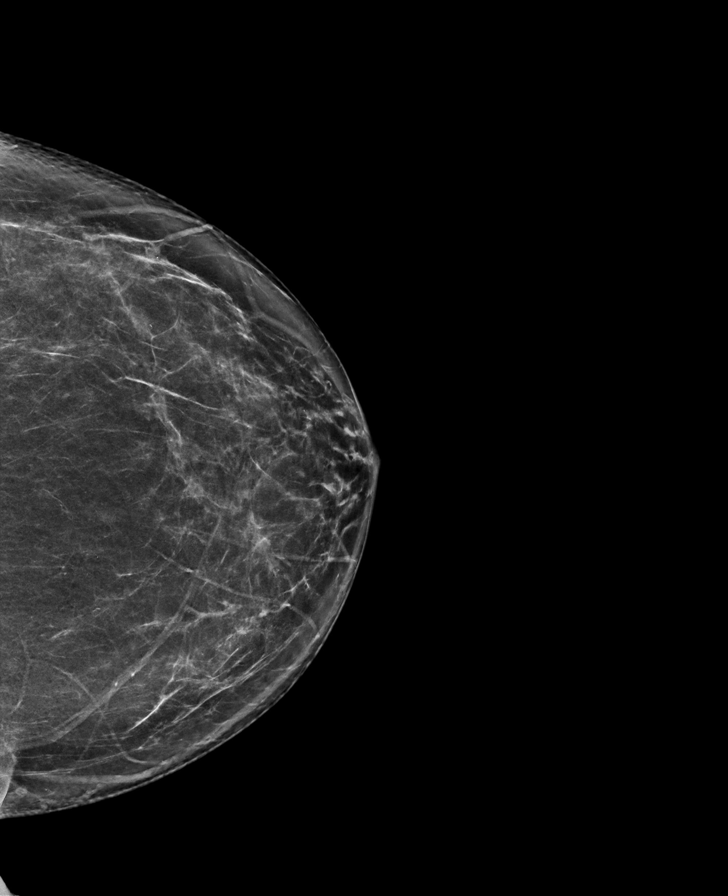

[R MLO synth-2D]
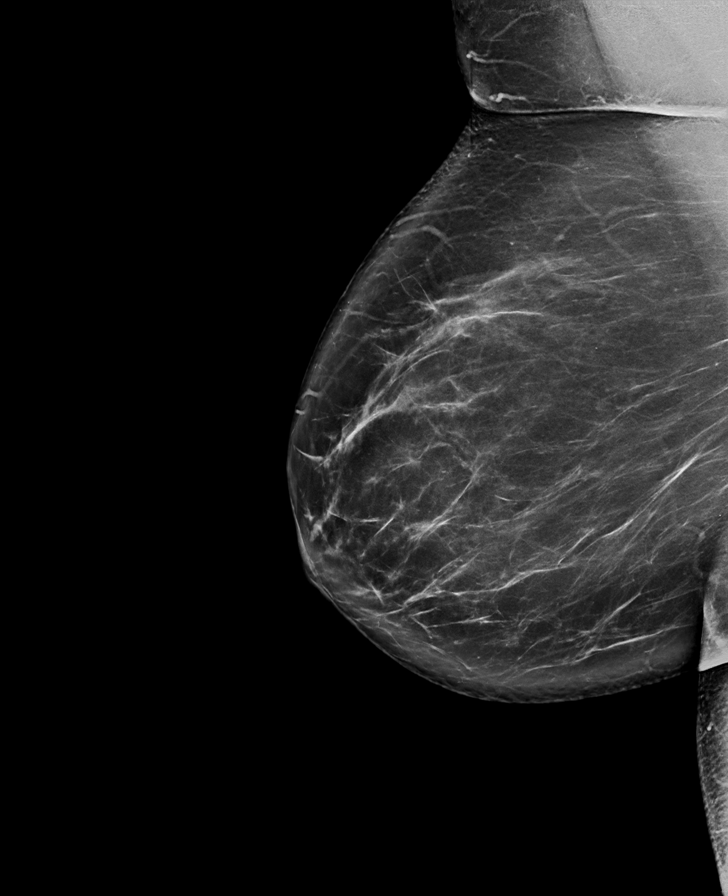

[R CC synth-2D]
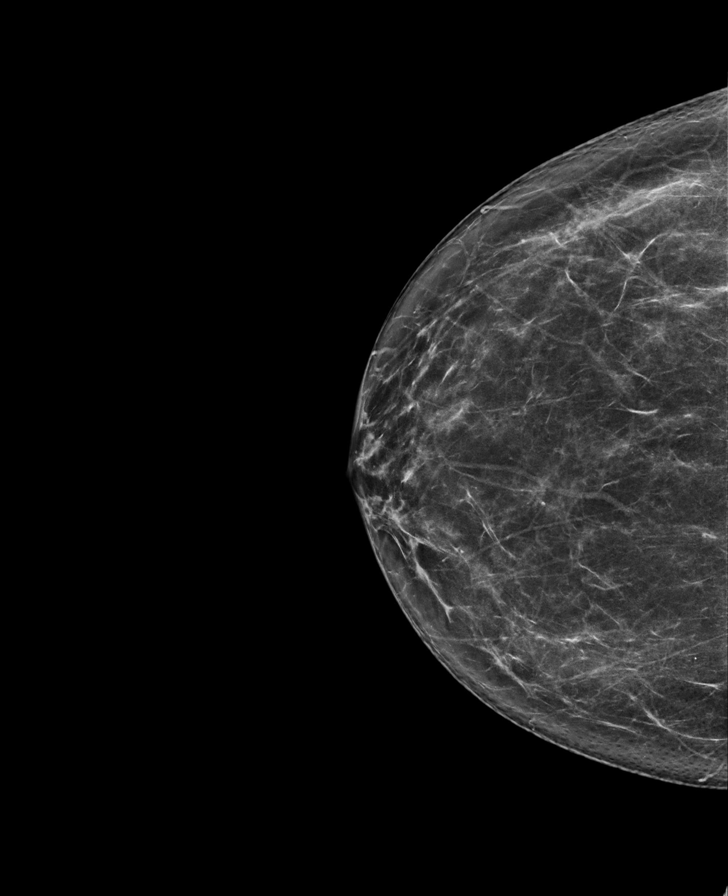

[L CC tomo · tomo slice 41/81.0]
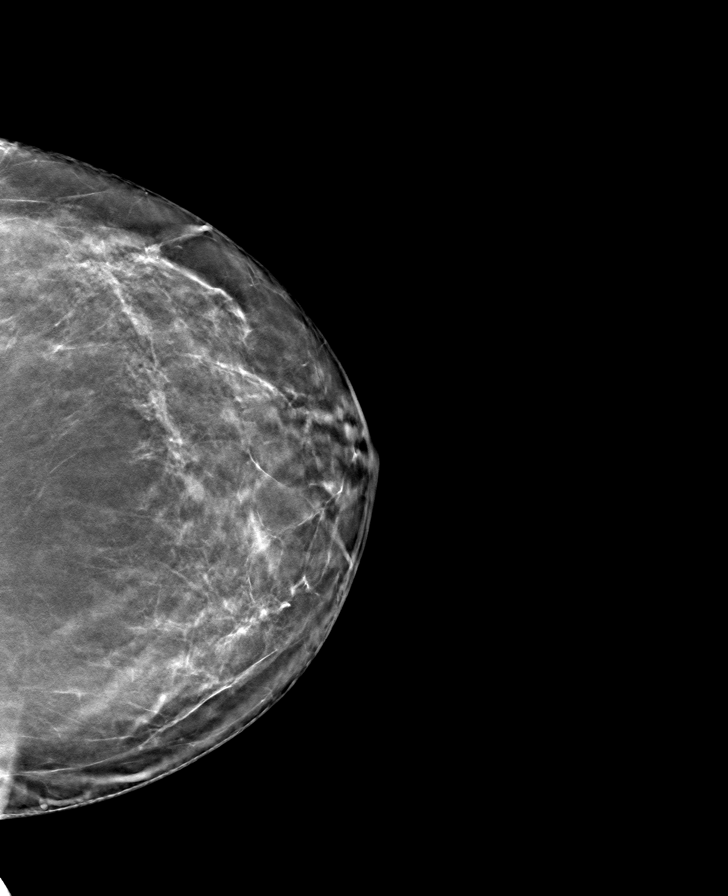

[R MLO tomo · tomo slice 51/100.0]
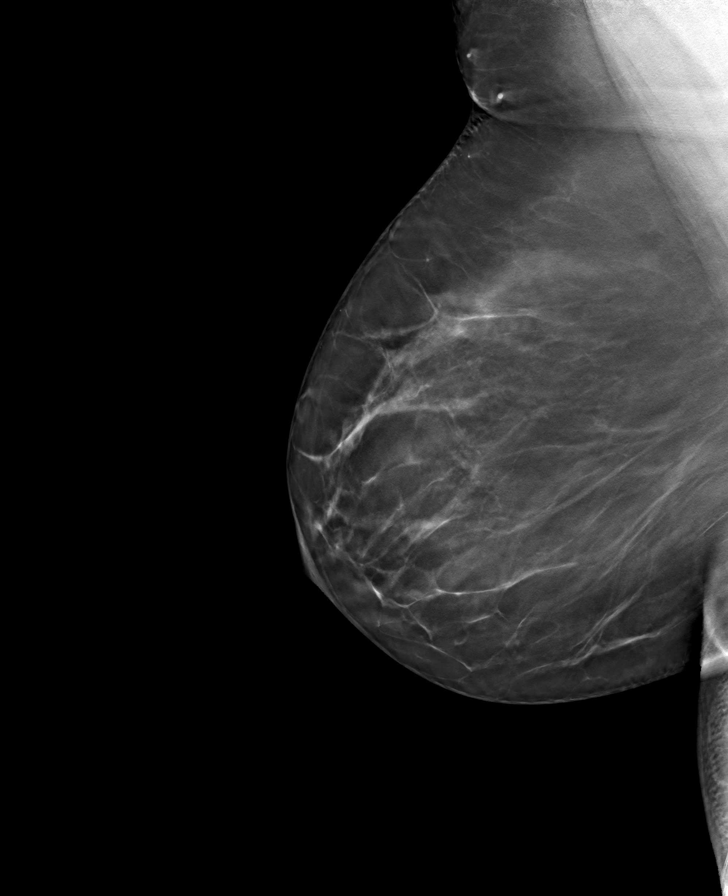

[L MLO tomo · tomo slice 49/98.0]
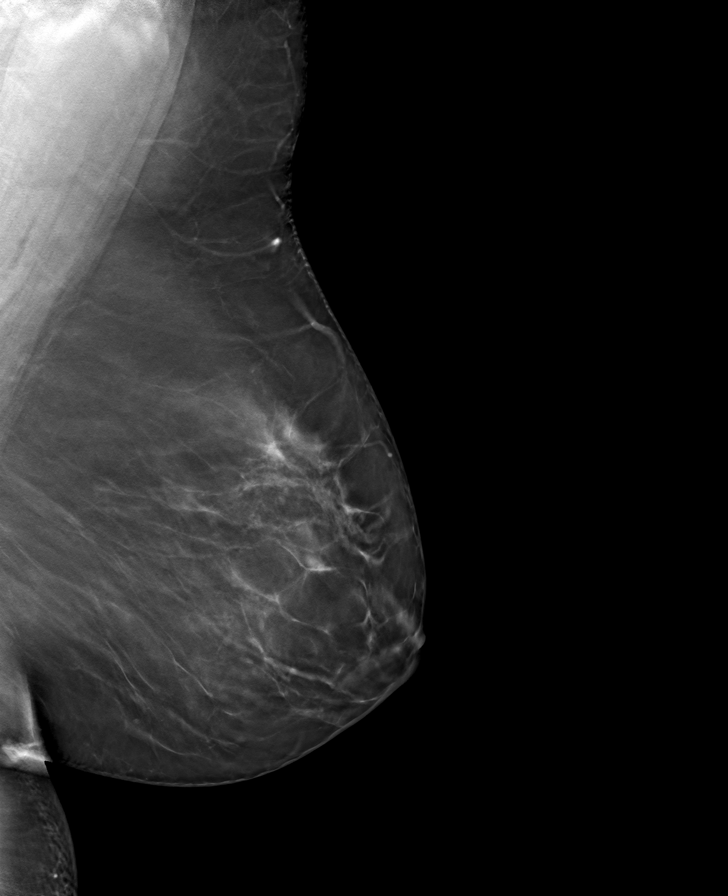

[R CC tomo · tomo slice 39/78.0]
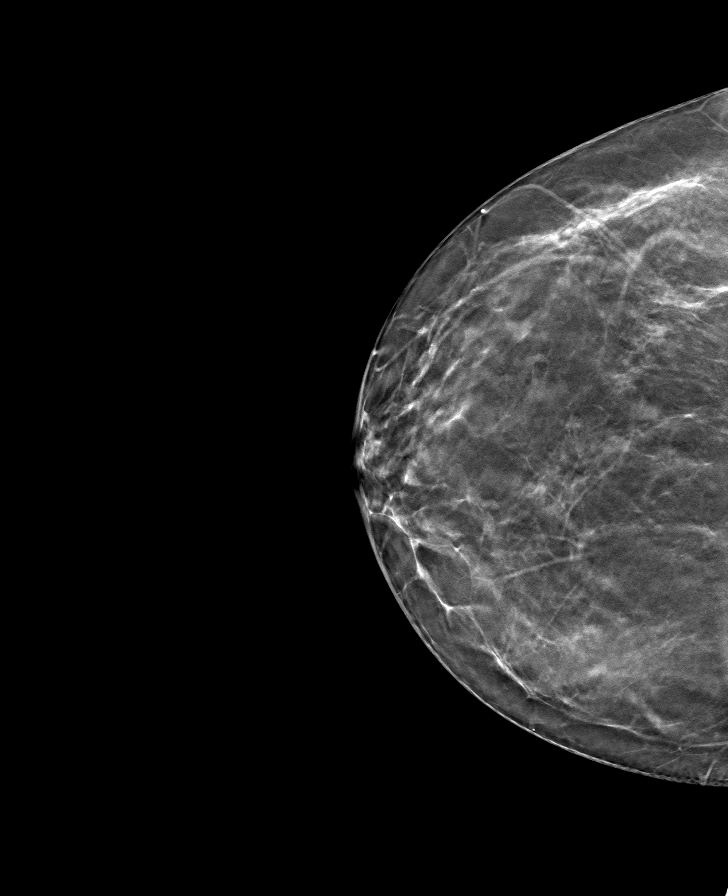

[8 of 24 positions shown; findings below may reference images not displayed]

ACR Breast Density Category b: There are scattered areas of
fibroglandular density.
FINDINGS: There are no findings suspicious for malignancy.
IMPRESSION: No mammographic evidence of malignancy. A result letter of this
screening mammogram will be mailed directly to the patient.

RECOMMENDATION:
Screening mammogram in one year. (Code:XG-X-X7B)

BI-RADS CATEGORY  1: Negative.

## 2024-01-09 ENCOUNTER — Encounter: Payer: Self-pay | Admitting: Family Medicine

## 2024-01-12 ENCOUNTER — Ambulatory Visit

## 2024-01-12 DIAGNOSIS — Z3042 Encounter for surveillance of injectable contraceptive: Secondary | ICD-10-CM

## 2024-01-12 MED ORDER — MEDROXYPROGESTERONE ACETATE 150 MG/ML IM SUSP
150.0000 mg | Freq: Once | INTRAMUSCULAR | Status: AC
  Administered 2024-01-12: 150 mg via INTRAMUSCULAR

## 2024-01-12 NOTE — Progress Notes (Signed)
 Patient is in office today for a nurse visit for Birth Control Injection. Patient Injection was given in the  Left deltoid. Patient tolerated injection well. Next depo due 03/29/24 - 04/12/24.

## 2024-01-19 NOTE — Telephone Encounter (Signed)
 Pt came in 8/7 for measurement and pick up form

## 2024-02-09 ENCOUNTER — Telehealth: Payer: Self-pay

## 2024-02-09 NOTE — Telephone Encounter (Signed)
 Copied from CRM [id number]. Topic: General - Other >> Feb 05, 2024  1:10 PM Cleave MATSU wrote: Reason for CRM: pt stated nurse filled out her form for [facility] wrong and she needs it fixed. It needs to be turned in in 4 days please call pt back about this >> Feb 05, 2024  4:39 PM Delon T wrote: Patient calling to follow up about [facility] form  Completed. CRM resolved

## 2024-04-05 ENCOUNTER — Ambulatory Visit

## 2024-04-05 ENCOUNTER — Ambulatory Visit (INDEPENDENT_AMBULATORY_CARE_PROVIDER_SITE_OTHER)

## 2024-04-05 DIAGNOSIS — Z23 Encounter for immunization: Secondary | ICD-10-CM | POA: Diagnosis not present

## 2024-04-05 MED ORDER — MEDROXYPROGESTERONE ACETATE 150 MG/ML IM SUSP
150.0000 mg | Freq: Once | INTRAMUSCULAR | Status: AC
  Administered 2024-04-05: 150 mg via INTRAMUSCULAR

## 2024-04-05 NOTE — Progress Notes (Signed)
 Patient is in office today for a nurse visit for Birth Control Injection. Patient Injection was given in the  Left deltoid. Patient tolerated injection well.

## 2024-04-08 ENCOUNTER — Other Ambulatory Visit: Payer: Self-pay | Admitting: Family Medicine

## 2024-04-09 NOTE — Telephone Encounter (Signed)
 Requested medications are due for refill today.  yes  Requested medications are on the active medications list.  yes  Last refill. 09/19/2023 #12 1 rf  Future visit scheduled.   yes  Notes to clinic.  Provider to review at this dosage.    Requested Prescriptions  Pending Prescriptions Disp Refills   Vitamin D , Ergocalciferol , (DRISDOL ) 1.25 MG (50000 UNIT) CAPS capsule [Pharmacy Med Name: VITAMIN D2 50,000IU (ERGO) CAP RX] 12 capsule 1    Sig: TAKE 1 CAPSULE BY MOUTH EVERY 7 DAYS     Endocrinology:  Vitamins - Vitamin D  Supplementation 2 Failed - 04/09/2024  1:40 PM      Failed - Manual Review: Route requests for 50,000 IU strength to the provider      Failed - Vitamin D  in normal range and within 360 days    Vit D, 25-Hydroxy  Date Value Ref Range Status  09/18/2023 21.1 (L) 30.0 - 100.0 ng/mL Final    Comment:    Vitamin D  deficiency has been defined by the Institute of Medicine and an Endocrine Society practice guideline as a level of serum 25-OH vitamin D  less than 20 ng/mL (1,2). The Endocrine Society went on to further define vitamin D  insufficiency as a level between 21 and 29 ng/mL (2). 1. IOM (Institute of Medicine). 2010. Dietary reference    intakes for calcium and D. [place]: The    [employer]. 2. Holick MF, Binkley [place], Bischoff-Ferrari HA, et al.    Evaluation, treatment, and prevention of vitamin D     deficiency: an Endocrine Society clinical practice    guideline. JCEM. 2011 Jul; 96(7):1911-30.          Passed - Ca in normal range and within 360 days    Calcium  Date Value Ref Range Status  09/18/2023 8.9 8.7 - 10.2 mg/dL Final   Calcium, Total  Date Value Ref Range Status  01/27/2014 8.8 8.5 - 10.1 mg/dL Final         Passed - Valid encounter within last 12 months    Recent Outpatient Visits           6 months ago Routine general medical examination at a health care facility   [facility], Megan  P, DO

## 2024-06-28 ENCOUNTER — Ambulatory Visit

## 2024-06-28 DIAGNOSIS — Z3042 Encounter for surveillance of injectable contraceptive: Secondary | ICD-10-CM | POA: Diagnosis not present

## 2024-06-28 MED ORDER — MEDROXYPROGESTERONE ACETATE 150 MG/ML IM SUSP
150.0000 mg | INTRAMUSCULAR | Status: AC
  Administered 2024-06-28: 150 mg via INTRAMUSCULAR

## 2024-06-28 NOTE — Progress Notes (Signed)
 Patient is in office today for a nurse visit for Birth Control Injection. Patient Injection was given in the  Left deltoid. Patient tolerated injection well. Next depo due 09/13/24 - 09/27/24.

## 2024-09-19 ENCOUNTER — Encounter: Admitting: Family Medicine
